# Patient Record
Sex: Female | Born: 1990 | Race: Black or African American | Hispanic: No | Marital: Married | Smoking: Never smoker
Health system: Southern US, Community
[De-identification: ages and names within clinical notes are randomized; demographics above are authoritative.]

## PROBLEM LIST (undated history)

## (undated) DIAGNOSIS — O99210 Obesity complicating pregnancy, unspecified trimester: Secondary | ICD-10-CM

## (undated) DIAGNOSIS — D649 Anemia, unspecified: Secondary | ICD-10-CM

## (undated) HISTORY — PX: NO PAST SURGERIES: SHX2092

---

## 2018-02-17 ENCOUNTER — Ambulatory Visit: Payer: 59 | Admitting: Podiatry

## 2018-02-17 ENCOUNTER — Encounter: Payer: Self-pay | Admitting: Podiatry

## 2018-02-17 ENCOUNTER — Ambulatory Visit (INDEPENDENT_AMBULATORY_CARE_PROVIDER_SITE_OTHER): Payer: 59

## 2018-02-17 VITALS — BP 126/82 | HR 80

## 2018-02-17 DIAGNOSIS — M779 Enthesopathy, unspecified: Secondary | ICD-10-CM

## 2018-02-17 MED ORDER — TRIAMCINOLONE ACETONIDE 10 MG/ML IJ SUSP
10.0000 mg | Freq: Once | INTRAMUSCULAR | Status: AC
Start: 2018-02-17 — End: 2018-02-17
  Administered 2018-02-17: 10 mg

## 2018-02-17 MED ORDER — DICLOFENAC SODIUM 75 MG PO TBEC: 75.0000 mg | DELAYED_RELEASE_TABLET | Freq: Two times a day (BID) | ORAL | 2 refills | Status: DC

## 2018-02-19 NOTE — Progress Notes (Signed)
Subjective:   Patient ID: Kelsey Ellison, female   DOB: 27 y.o.   MRN: [id number]   HPI Patient presents with a lot of pain in the outside of the right ankle and does not remember specific injury.  States is been present for around a month   Review of Systems  All other systems reviewed and are negative.       Objective:  Physical Exam  Constitutional: She appears well-developed and well-nourished.  Cardiovascular: Intact distal pulses.  Pulmonary/Chest: Effort normal.  Musculoskeletal: Normal range of motion.  Neurological: She is alert.  Skin: Skin is warm.  Nursing note and vitals reviewed.   Neurovascular status intact muscle strength is adequate range of motion within normal limits with patient found to have exquisite discomfort in the peroneal insertion base of fifth metatarsal right with fluid buildup noted     Assessment:  Inflammatory tendinitis of the peroneal insertion into the base of the fifth metatarsal right     Plan:  H&P condition reviewed and recommended careful sheath injection right 3 mg Kenalog 5 mg Xylocaine after explaining risk associated with injection.  I then applied a fascial brace to hold up the lateral side of the foot instructed on aggressive ice and placed on diclofenac 75 mg twice daily.  Reappoint in the next several weeks  X-rays were negative for signs of fracture or bone pathology associated with this

## 2018-02-22 ENCOUNTER — Telehealth: Payer: Self-pay | Admitting: Podiatry

## 2018-02-22 MED ORDER — NONFORMULARY OR COMPOUNDED ITEM: 2 refills | Status: DC

## 2018-02-22 NOTE — Telephone Encounter (Signed)
I informed pt Dr. Charlsie Merlesegal would like her to use a topical antiinflammatory cream, it would come from [facility] [phone], they will call with insurance coverage and delivery information. Pt states she will try this medication. [facility]

## 2018-02-22 NOTE — Addendum Note (Signed)
Addended by: Alphia Kava'CONNELL, Lashon Hillier D on: 02/22/2018 03:07 PM   Modules accepted: Orders

## 2018-02-22 NOTE — Telephone Encounter (Signed)
Medication patient was prescribed is not agreeing with her stomach. She wants to know if there is an alternative choice? If you can call patient back at [phone]

## 2018-02-22 NOTE — Telephone Encounter (Signed)
Not sure what medication she was taking

## 2018-08-23 DIAGNOSIS — Z0100 Encounter for examination of eyes and vision without abnormal findings: Secondary | ICD-10-CM | POA: Diagnosis not present

## 2019-06-23 ENCOUNTER — Emergency Department (HOSPITAL_COMMUNITY)
Admission: EM | Admit: 2019-06-23 | Discharge: 2019-06-24 | Disposition: A | Discharge status: Home / Self Care | Payer: 59 | Attending: Emergency Medicine | Admitting: Emergency Medicine

## 2019-06-23 ENCOUNTER — Other Ambulatory Visit: Payer: Self-pay

## 2019-06-23 ENCOUNTER — Encounter (HOSPITAL_COMMUNITY): Payer: Self-pay | Admitting: *Deleted

## 2019-06-23 DIAGNOSIS — E669 Obesity, unspecified: Secondary | ICD-10-CM | POA: Insufficient documentation

## 2019-06-23 DIAGNOSIS — N760 Acute vaginitis: Secondary | ICD-10-CM | POA: Diagnosis not present

## 2019-06-23 DIAGNOSIS — Z113 Encounter for screening for infections with a predominantly sexual mode of transmission: Secondary | ICD-10-CM | POA: Diagnosis not present

## 2019-06-23 DIAGNOSIS — B9689 Other specified bacterial agents as the cause of diseases classified elsewhere: Secondary | ICD-10-CM | POA: Diagnosis not present

## 2019-06-23 DIAGNOSIS — R1031 Right lower quadrant pain: Secondary | ICD-10-CM

## 2019-06-23 DIAGNOSIS — Z79899 Other long term (current) drug therapy: Secondary | ICD-10-CM | POA: Diagnosis not present

## 2019-06-23 DIAGNOSIS — R109 Unspecified abdominal pain: Secondary | ICD-10-CM | POA: Diagnosis not present

## 2019-06-23 LAB — URINALYSIS, ROUTINE W REFLEX MICROSCOPIC
Bilirubin Urine: NEGATIVE
Glucose, UA: NEGATIVE mg/dL
Hgb urine dipstick: NEGATIVE
Ketones, ur: NEGATIVE mg/dL
Nitrite: NEGATIVE
Protein, ur: NEGATIVE mg/dL
Specific Gravity, Urine: 1.033 — ABNORMAL HIGH (ref 1.005–1.030)
WBC, UA: >50 WBC/hpf — ABNORMAL HIGH (ref 0–5)
pH: 5 (ref 5.0–8.0)

## 2019-06-23 LAB — CBC WITH DIFFERENTIAL/PLATELET
Abs Immature Granulocytes: 0.02 10*3/uL (ref 0.00–0.07)
Basophils Absolute: 0.1 10*3/uL (ref 0.0–0.1)
Basophils Relative: 1 %
Eosinophils Absolute: 0.1 10*3/uL (ref 0.0–0.5)
Eosinophils Relative: 1 %
HCT: 39.2 % (ref 36.0–46.0)
Hemoglobin: 12 g/dL (ref 12.0–15.0)
Immature Granulocytes: 0 %
Lymphocytes Relative: 28 %
Lymphs Abs: 2.8 10*3/uL (ref 0.7–4.0)
MCH: 27.9 pg (ref 26.0–34.0)
MCHC: 30.6 g/dL (ref 30.0–36.0)
MCV: 91.2 fL (ref 80.0–100.0)
Monocytes Absolute: 0.5 10*3/uL (ref 0.1–1.0)
Monocytes Relative: 5 %
Neutro Abs: 6.4 10*3/uL (ref 1.7–7.7)
Neutrophils Relative %: 65 %
Platelets: 401 10*3/uL — ABNORMAL HIGH (ref 150–400)
RBC: 4.3 MIL/uL (ref 3.87–5.11)
RDW: 14.3 % (ref 11.5–15.5)
WBC: 9.9 10*3/uL (ref 4.0–10.5)
nRBC: 0 % (ref 0.0–0.2)

## 2019-06-23 LAB — WET PREP, GENITAL
Sperm: NONE SEEN
Trich, Wet Prep: NONE SEEN
Yeast Wet Prep HPF POC: NONE SEEN

## 2019-06-23 LAB — I-STAT BETA HCG BLOOD, ED (MC, WL, AP ONLY): I-stat hCG, quantitative: <5 m[IU]/mL (ref <5)

## 2019-06-23 MED ORDER — SODIUM CHLORIDE 0.9 % IV BOLUS
1000.0000 mL | Freq: Once | INTRAVENOUS | Status: AC
  Administered 2019-06-23: 1000 mL via INTRAVENOUS

## 2019-06-23 NOTE — ED Triage Notes (Signed)
Lower abdominal cramping that started 4 days ago, after her period stopped. She is also having some intermittent vaginal bleeding. No n/v/d or fevers, urinary symptoms or vaginal d/c

## 2019-06-23 NOTE — ED Provider Notes (Signed)
[facility] EM[facility]GENCY [facility]PART[facility] Provider Note   CSN: [id number] Arrival date & time: 06/23/19  2021     History   Chief Complaint Chief Complaint  Patient presents with  . Pelvic Pain    HPI Kelsey Ellison is a 28 y.o. female with no significant past medical history who presents today for evaluation of abdominal pain.  Her pain started 4 days ago.  When it started it was initially in the midline upper abdomen.  She reports that since then it has settled into the lower abdomen and feels cramping in nature.  She has tried Aleve and Tylenol with minor relief.  She denies any constipation, nausea, vomiting, or diarrhea.  She is sexually active with one female partner and denies concerns about STIs.  She denies any abnormal vaginal discharge or bleeding.  Her last menstrual cycle was normal.  She reports that originally laying flat helped however it is no longer helping.  She denies any previous abdominal surgeries.HPI  History reviewed. No pertinent past medical history.  There are no active problems to display for this patient.   History reviewed. No pertinent surgical history.   OB History   No obstetric history on file.      Home Medications    Prior to Admission medications   Medication Sig Start Date End Date Taking? Authorizing Provider  acetaminophen (TYLENOL) 500 MG tablet Take 1,000 mg by mouth every 6 (six) hours as needed (pain).   Yes [provider]  Multiple Vitamin (MULTIVITAMIN WITH MINERALS) TABS tablet Take 1 tablet by mouth every morning.   Yes [provider]  naproxen sodium (ALEVE) 220 MG tablet Take 220-440 mg by mouth 2 (two) times daily as needed (pain).   Yes [provider]  diclofenac (VOLTAREN) 75 MG EC tablet Take 1 tablet (75 mg total) by mouth 2 (two) times daily. Patient not taking: Reported on 06/23/2019 02/17/18   Lenn Sinkegal, Norman S, DPM  metroNIDAZOLE (FLAGYL) 500 MG tablet Take 1 tablet (500 mg total)  by mouth 2 (two) times daily. 06/24/19   Cristina GongHammond, Rylea Selway W, PA-C  NONFORMULARY OR COMPOUNDED ITEM [facility]:  Antiinflammatory Cream - Diclofenac 3%, Baclofen 2%, Lidocaine 2%, apply 1-2 grams to affected area 3-4 times a day. Patient not taking: Reported on 06/23/2019 02/22/18   Lenn Sinkegal, Norman S, DPM    Family History No family history on file.  Social History Social History   Tobacco Use  . Smoking status: Never Smoker  . Smokeless tobacco: Never Used  Substance Use Topics  . Alcohol use: Yes  . Drug use: Not Currently     Allergies   Patient has no known allergies.   Review of Systems Review of Systems  Constitutional: Negative for chills and fever.  Respiratory: Negative for chest tightness and shortness of breath.   Cardiovascular: Negative for chest pain.  Gastrointestinal: Positive for abdominal pain. Negative for diarrhea, nausea and vomiting.  Genitourinary: Negative for decreased urine volume, difficulty urinating, dysuria, flank pain, frequency, pelvic pain, urgency, vaginal bleeding, vaginal discharge and vaginal pain.  Musculoskeletal: Negative for back pain.  All other systems reviewed and are negative.    Physical Exam Updated Vital Signs BP (!) 169/68 (BP Location: Left Arm)   Pulse 62   Temp 99 F (37.2 C) (Oral)   Resp 16   LMP 06/09/2019   SpO2 100%   Physical Exam Vitals signs and nursing note reviewed. Exam conducted with a chaperone present (Female RN).  Constitutional:  General: She is not in acute distress.    Appearance: She is well-developed. She is obese.  HENT:     Head: Normocephalic and atraumatic.  Eyes:     Conjunctiva/sclera: Conjunctivae normal.  Neck:     Musculoskeletal: Neck supple.  Cardiovascular:     Rate and Rhythm: Normal rate and regular rhythm.     Heart sounds: No murmur.  Pulmonary:     Effort: Pulmonary effort is normal. No respiratory distress.     Breath sounds: Normal breath sounds.  Abdominal:      General: Bowel sounds are normal. There is no distension.     Palpations: Abdomen is soft.     Tenderness: There is abdominal tenderness in the right lower quadrant and suprapubic area. There is no right CVA tenderness, left CVA tenderness, guarding or rebound.     Comments: Exam limited by body habitus  Genitourinary:    Vagina: Normal.     Cervix: Normal.     Adnexa: Right adnexa normal and left adnexa normal.     Comments: Normal external female genitalia.  Mild midline TTP with out CMT.  Skin:    General: Skin is warm and dry.  Neurological:     Mental Status: She is alert.      ED Treatments / Results  Labs (all labs ordered are listed, but only abnormal results are displayed) Labs Reviewed  WET PREP, GENITAL - Abnormal; Notable for the following components:      Result Value   Clue Cells Wet Prep HPF POC PRESENT (*)    WBC, Wet Prep HPF POC MANY (*)    All other components within normal limits  URINALYSIS, ROUTINE W REFLEX MICROSCOPIC - Abnormal; Notable for the following components:   APPearance HAZY (*)    Specific Gravity, Urine 1.033 (*)    Leukocytes,Ua LARGE (*)    WBC, UA >50 (*)    Bacteria, UA RARE (*)    All other components within normal limits  COMPREHENSIVE METABOLIC PANEL - Abnormal; Notable for the following components:   Sodium 133 (*)    Calcium 8.8 (*)    Albumin 3.4 (*)    All other components within normal limits  CBC WITH DIFFERENTIAL/PLATELET - Abnormal; Notable for the following components:   Platelets 401 (*)    All other components within normal limits  LIPASE, BLOOD  I-STAT BETA HCG BLOOD, ED ([facility], [facility], [facility] ONLY)  GC/CHLAMYDIA PROBE AMP ([place]) NOT AT [facility]    EKG None  Radiology Ct Abdomen Pelvis W Contrast  Result Date: 06/24/2019 CLINICAL DATA:  Abdominal pain EXAM: CT ABDOMEN AND PELVIS WITH CONTRAST TECHNIQUE: Multidetector CT imaging of the abdomen and pelvis was performed using the standard protocol following bolus  administration of intravenous contrast. CONTRAST:  OMNIPAQUE IOHEXOL 300 MG/ML  SOLN COMPARISON:  None. FINDINGS: LOWER CHEST: No basilar pleural or apical pericardial effusion. HEPATOBILIARY: Normal hepatic contours. There is no intra- or extrahepatic biliary dilatation. The gallbladder is normal. PANCREAS: Normal pancreatic contours without pancreatic ductal dilatation or peripancreatic fluid collection. SPLEEN: Normal. ADRENALS/URINARY TRACT: --Adrenal glands: Normal. --Right kidney/ureter: No hydronephrosis, nephroureterolithiasis or solid renal mass. --Left kidney/ureter: No hydronephrosis, nephroureterolithiasis or solid renal mass. --Urinary bladder: Normal for degree of distention STOMACH/BOWEL: --Stomach/Duodenum: There is no hiatal hernia. The duodenal course and caliber are normal. --Small bowel: No dilatation or inflammation. --Colon: No focal abnormality. --Appendix: Normal. VASCULAR/LYMPHATIC: Normal course and caliber of the major abdominal vessels. No abdominal or pelvic lymphadenopathy. REPRODUCTIVE:  Normal uterus and ovaries. MUSCULOSKELETAL. No bony spinal canal stenosis or focal osseous abnormality. OTHER: None. IMPRESSION: No acute abdominopelvic abnormality. Electronically Signed   By: Ulyses Jarred M.D.   On: 06/24/2019 01:12    Procedures Procedures (including critical care time)  Medications Ordered in ED Medications  sodium chloride 0.9 % bolus 1,000 mL (1,000 mLs Intravenous New Bag/Given 06/23/19 2358)  iohexol (OMNIPAQUE) 300 MG/ML solution 100 mL (100 mLs Intravenous Contrast Given 06/24/19 0041)     Initial Impression / Assessment and Plan / ED Course  I have reviewed the triage vital signs and the nursing notes.  Pertinent labs & imaging results that were available during my care of the patient were reviewed by me and considered in my medical decision making (see chart for details).       Patient presents today for evaluation of abdominal pain.  Her abdominal  pain initially started in the upper midline however is now in the lower abdomen.  She denies any nausea vomiting diarrhea or constipation.  Here she is afebrile and hypertensive but generally well-appearing.  Pelvic exam was performed with slight midline tenderness.  Labs were obtained and reviewed, her pregnancy test is negative.  Lipase is not elevated.  CMP and CBC without significant hematologic or electrolyte derangements.  Gonorrhea chlamydia testing was sent.  Wet prep is positive for clue cells and white blood cells. On exam she had tenderness to palpation in the right lower abdominal quadrant.  She still has her appendix.  CT scan abdomen pelvis was obtained without evidence of acute abnormalities.  While her urine did have over 50 white blood cells it was also contaminated with 6-10 squamous epithelial cells and her wet prep shows many white blood cells.  Given her lack of urinary symptoms I will not treat her for urinary tract infection at this time.  Recommended OB/GYN follow-up.  Return precautions were discussed with patient who states their understanding.  At the time of discharge patient denied any unaddressed complaints or concerns.  Patient is agreeable for discharge home.   Final Clinical Impressions(s) / ED Diagnoses   Final diagnoses:  Bacterial vaginosis  Right lower quadrant abdominal pain    ED Discharge Orders         Ordered    metroNIDAZOLE (FLAGYL) 500 MG tablet  2 times daily     06/24/19 0113           Lorin Glass, PA-C 06/24/19 0124    Veryl Speak, MD 06/24/19 0140

## 2019-06-24 ENCOUNTER — Emergency Department (HOSPITAL_COMMUNITY): Payer: 59

## 2019-06-24 DIAGNOSIS — R109 Unspecified abdominal pain: Secondary | ICD-10-CM | POA: Diagnosis not present

## 2019-06-24 DIAGNOSIS — N760 Acute vaginitis: Secondary | ICD-10-CM | POA: Diagnosis not present

## 2019-06-24 DIAGNOSIS — E669 Obesity, unspecified: Secondary | ICD-10-CM | POA: Diagnosis not present

## 2019-06-24 DIAGNOSIS — Z113 Encounter for screening for infections with a predominantly sexual mode of transmission: Secondary | ICD-10-CM | POA: Diagnosis not present

## 2019-06-24 DIAGNOSIS — Z79899 Other long term (current) drug therapy: Secondary | ICD-10-CM | POA: Diagnosis not present

## 2019-06-24 LAB — COMPREHENSIVE METABOLIC PANEL
ALT: 18 U/L (ref 0–44)
AST: 16 U/L (ref 15–41)
Albumin: 3.4 g/dL — ABNORMAL LOW (ref 3.5–5.0)
Alkaline Phosphatase: 60 U/L (ref 38–126)
Anion gap: 6 (ref 5–15)
BUN: 8 mg/dL (ref 6–20)
CO2: 23 mmol/L (ref 22–32)
Calcium: 8.8 mg/dL — ABNORMAL LOW (ref 8.9–10.3)
Chloride: 104 mmol/L (ref 98–111)
Creatinine, Ser: 0.82 mg/dL (ref 0.44–1.00)
GFR calc Af Amer: >60 mL/min (ref >60)
GFR calc non Af Amer: >60 mL/min (ref >60)
Glucose, Bld: 96 mg/dL (ref 70–99)
Potassium: 3.9 mmol/L (ref 3.5–5.1)
Sodium: 133 mmol/L — ABNORMAL LOW (ref 135–145)
Total Bilirubin: 0.6 mg/dL (ref 0.3–1.2)
Total Protein: 7.7 g/dL (ref 6.5–8.1)

## 2019-06-24 LAB — LIPASE, BLOOD: Lipase: 22 U/L (ref 11–51)

## 2019-06-24 MED ORDER — IOHEXOL 300 MG/ML  SOLN
100.0000 mL | Freq: Once | INTRAMUSCULAR | Status: AC | PRN
  Administered 2019-06-24: 100 mL via INTRAVENOUS

## 2019-06-24 MED ORDER — METRONIDAZOLE 500 MG PO TABS: 500.0000 mg | ORAL_TABLET | Freq: Two times a day (BID) | ORAL | 0 refills | Status: DC

## 2019-06-24 NOTE — Discharge Instructions (Addendum)
Today your diagnosed with bacterial vaginosis and received a prescription for metronidazole also known as Flagyl. It is very important that you do not consume any alcohol while taking this medication as it will cause you to become violently ill.  Please take Ibuprofen (Advil, motrin) and Tylenol (acetaminophen) to relieve your pain.  You may take up to 600 MG (3 pills) of normal strength ibuprofen every 8 hours as needed.  In between doses of ibuprofen you make take tylenol, up to 1,000 mg (two extra strength pills).  Do not take more than 3,000 mg tylenol in a 24 hour period.  Please check all medication labels as many medications such as pain and cold medications may contain tylenol.  Do not drink alcohol while taking these medications.  Do not take other NSAID'S while taking ibuprofen (such as aleve or naproxen).  Please take ibuprofen with food to decrease stomach upset.  You may have diarrhea from the antibiotics.  It is very important that you continue to take the antibiotics even if you get diarrhea unless a medical professional tells you that you may stop taking them.  If you stop too early the bacteria you are being treated for will become stronger and you may need different, more powerful antibiotics that have more side effects and worsening diarrhea.  Please stay well hydrated and consider probiotics as they may decrease the severity of your diarrhea.  Please be aware that if you take any hormonal contraception (birth control pills, nexplanon, the ring, etc) that your birth control will not work while you are taking antibiotics and you need to use back up protection as directed on the birth control medication information insert.

## 2019-06-25 LAB — GC/CHLAMYDIA PROBE AMP (~~LOC~~) NOT AT ARMC
Chlamydia: NEGATIVE
Neisseria Gonorrhea: NEGATIVE

## 2019-07-02 DIAGNOSIS — R102 Pelvic and perineal pain: Secondary | ICD-10-CM | POA: Diagnosis not present

## 2019-07-16 DIAGNOSIS — N83202 Unspecified ovarian cyst, left side: Secondary | ICD-10-CM | POA: Diagnosis not present

## 2019-07-16 DIAGNOSIS — R102 Pelvic and perineal pain: Secondary | ICD-10-CM | POA: Diagnosis not present

## 2019-07-26 DIAGNOSIS — R102 Pelvic and perineal pain: Secondary | ICD-10-CM | POA: Diagnosis not present

## 2019-08-12 DIAGNOSIS — Z01419 Encounter for gynecological examination (general) (routine) without abnormal findings: Secondary | ICD-10-CM | POA: Diagnosis not present

## 2019-08-12 DIAGNOSIS — Z113 Encounter for screening for infections with a predominantly sexual mode of transmission: Secondary | ICD-10-CM | POA: Diagnosis not present

## 2019-08-12 DIAGNOSIS — Z3040 Encounter for surveillance of contraceptives, unspecified: Secondary | ICD-10-CM | POA: Diagnosis not present

## 2019-08-12 DIAGNOSIS — N83209 Unspecified ovarian cyst, unspecified side: Secondary | ICD-10-CM | POA: Diagnosis not present

## 2019-08-12 DIAGNOSIS — Z124 Encounter for screening for malignant neoplasm of cervix: Secondary | ICD-10-CM | POA: Diagnosis not present

## 2019-08-12 DIAGNOSIS — R102 Pelvic and perineal pain: Secondary | ICD-10-CM | POA: Diagnosis not present

## 2019-08-29 DIAGNOSIS — Z0100 Encounter for examination of eyes and vision without abnormal findings: Secondary | ICD-10-CM | POA: Diagnosis not present

## 2019-09-30 DIAGNOSIS — N920 Excessive and frequent menstruation with regular cycle: Secondary | ICD-10-CM | POA: Diagnosis not present

## 2019-09-30 DIAGNOSIS — N83202 Unspecified ovarian cyst, left side: Secondary | ICD-10-CM | POA: Diagnosis not present

## 2019-09-30 DIAGNOSIS — Z6841 Body Mass Index (BMI) 40.0 and over, adult: Secondary | ICD-10-CM | POA: Diagnosis not present

## 2019-09-30 DIAGNOSIS — R102 Pelvic and perineal pain: Secondary | ICD-10-CM | POA: Diagnosis not present

## 2019-09-30 DIAGNOSIS — Z01419 Encounter for gynecological examination (general) (routine) without abnormal findings: Secondary | ICD-10-CM | POA: Diagnosis not present

## 2019-09-30 DIAGNOSIS — Z1151 Encounter for screening for human papillomavirus (HPV): Secondary | ICD-10-CM | POA: Diagnosis not present

## 2019-10-01 DIAGNOSIS — N920 Excessive and frequent menstruation with regular cycle: Secondary | ICD-10-CM | POA: Diagnosis not present

## 2019-10-01 DIAGNOSIS — N946 Dysmenorrhea, unspecified: Secondary | ICD-10-CM | POA: Diagnosis not present

## 2019-10-01 DIAGNOSIS — N83202 Unspecified ovarian cyst, left side: Secondary | ICD-10-CM | POA: Diagnosis not present

## 2019-10-01 DIAGNOSIS — Z1329 Encounter for screening for other suspected endocrine disorder: Secondary | ICD-10-CM | POA: Diagnosis not present

## 2019-10-01 DIAGNOSIS — R1032 Left lower quadrant pain: Secondary | ICD-10-CM | POA: Diagnosis not present

## 2019-10-01 DIAGNOSIS — Z130 Encounter for screening for diseases of the blood and blood-forming organs and certain disorders involving the immune mechanism: Secondary | ICD-10-CM | POA: Diagnosis not present

## 2019-10-01 DIAGNOSIS — Z131 Encounter for screening for diabetes mellitus: Secondary | ICD-10-CM | POA: Diagnosis not present

## 2019-10-01 DIAGNOSIS — Z0000 Encounter for general adult medical examination without abnormal findings: Secondary | ICD-10-CM | POA: Diagnosis not present

## 2019-10-01 DIAGNOSIS — Z13220 Encounter for screening for lipoid disorders: Secondary | ICD-10-CM | POA: Diagnosis not present

## 2019-10-10 DIAGNOSIS — M62838 Other muscle spasm: Secondary | ICD-10-CM | POA: Diagnosis not present

## 2019-10-10 DIAGNOSIS — R10814 Left lower quadrant abdominal tenderness: Secondary | ICD-10-CM | POA: Diagnosis not present

## 2019-10-10 DIAGNOSIS — N3941 Urge incontinence: Secondary | ICD-10-CM | POA: Diagnosis not present

## 2019-10-10 DIAGNOSIS — M6281 Muscle weakness (generalized): Secondary | ICD-10-CM | POA: Diagnosis not present

## 2019-10-10 DIAGNOSIS — R1032 Left lower quadrant pain: Secondary | ICD-10-CM | POA: Diagnosis not present

## 2019-10-14 DIAGNOSIS — M62838 Other muscle spasm: Secondary | ICD-10-CM | POA: Diagnosis not present

## 2019-10-14 DIAGNOSIS — R1032 Left lower quadrant pain: Secondary | ICD-10-CM | POA: Diagnosis not present

## 2019-10-14 DIAGNOSIS — R10814 Left lower quadrant abdominal tenderness: Secondary | ICD-10-CM | POA: Diagnosis not present

## 2019-10-14 DIAGNOSIS — N3941 Urge incontinence: Secondary | ICD-10-CM | POA: Diagnosis not present

## 2019-10-14 DIAGNOSIS — M6281 Muscle weakness (generalized): Secondary | ICD-10-CM | POA: Diagnosis not present

## 2019-10-17 DIAGNOSIS — Z3689 Encounter for other specified antenatal screening: Secondary | ICD-10-CM | POA: Diagnosis not present

## 2019-10-17 DIAGNOSIS — O26851 Spotting complicating pregnancy, first trimester: Secondary | ICD-10-CM | POA: Diagnosis not present

## 2019-10-17 DIAGNOSIS — Z3A01 Less than 8 weeks gestation of pregnancy: Secondary | ICD-10-CM | POA: Diagnosis not present

## 2019-10-17 DIAGNOSIS — Z3200 Encounter for pregnancy test, result unknown: Secondary | ICD-10-CM | POA: Diagnosis not present

## 2019-10-21 DIAGNOSIS — O26859 Spotting complicating pregnancy, unspecified trimester: Secondary | ICD-10-CM | POA: Diagnosis not present

## 2019-10-21 DIAGNOSIS — Z3A08 8 weeks gestation of pregnancy: Secondary | ICD-10-CM | POA: Diagnosis not present

## 2019-10-23 DIAGNOSIS — O039 Complete or unspecified spontaneous abortion without complication: Secondary | ICD-10-CM | POA: Diagnosis not present

## 2019-10-28 DIAGNOSIS — O039 Complete or unspecified spontaneous abortion without complication: Secondary | ICD-10-CM | POA: Diagnosis not present

## 2019-10-31 DIAGNOSIS — R1032 Left lower quadrant pain: Secondary | ICD-10-CM | POA: Diagnosis not present

## 2019-10-31 DIAGNOSIS — M62838 Other muscle spasm: Secondary | ICD-10-CM | POA: Diagnosis not present

## 2019-10-31 DIAGNOSIS — M6281 Muscle weakness (generalized): Secondary | ICD-10-CM | POA: Diagnosis not present

## 2019-10-31 DIAGNOSIS — N3941 Urge incontinence: Secondary | ICD-10-CM | POA: Diagnosis not present

## 2019-10-31 DIAGNOSIS — R10814 Left lower quadrant abdominal tenderness: Secondary | ICD-10-CM | POA: Diagnosis not present

## 2019-11-13 DIAGNOSIS — M6281 Muscle weakness (generalized): Secondary | ICD-10-CM | POA: Diagnosis not present

## 2019-11-13 DIAGNOSIS — M62838 Other muscle spasm: Secondary | ICD-10-CM | POA: Diagnosis not present

## 2019-11-13 DIAGNOSIS — R10814 Left lower quadrant abdominal tenderness: Secondary | ICD-10-CM | POA: Diagnosis not present

## 2019-11-13 DIAGNOSIS — R1032 Left lower quadrant pain: Secondary | ICD-10-CM | POA: Diagnosis not present

## 2019-11-13 DIAGNOSIS — N3941 Urge incontinence: Secondary | ICD-10-CM | POA: Diagnosis not present

## 2019-11-20 DIAGNOSIS — M62838 Other muscle spasm: Secondary | ICD-10-CM | POA: Diagnosis not present

## 2019-11-20 DIAGNOSIS — R1032 Left lower quadrant pain: Secondary | ICD-10-CM | POA: Diagnosis not present

## 2019-11-20 DIAGNOSIS — R10814 Left lower quadrant abdominal tenderness: Secondary | ICD-10-CM | POA: Diagnosis not present

## 2019-11-20 DIAGNOSIS — N3941 Urge incontinence: Secondary | ICD-10-CM | POA: Diagnosis not present

## 2019-11-20 DIAGNOSIS — M6281 Muscle weakness (generalized): Secondary | ICD-10-CM | POA: Diagnosis not present

## 2019-11-22 DIAGNOSIS — N96 Recurrent pregnancy loss: Secondary | ICD-10-CM | POA: Diagnosis not present

## 2019-12-06 DIAGNOSIS — Z298 Encounter for other specified prophylactic measures: Secondary | ICD-10-CM | POA: Diagnosis not present

## 2019-12-22 IMAGING — CT CT ABD-PELV W/ CM
2 of 4 series · 16 of 46 positions shown, 18 images · IV contrast (APPLIED)
Comparison: None.

CLINICAL DATA: Abdominal pain

EXAM:
CT ABDOMEN AND PELVIS WITH CONTRAST
TECHNIQUE: Multidetector CT imaging of the abdomen and pelvis was performed
using the standard protocol following bolus administration of
intravenous contrast.
CONTRAST:  100mL OMNIPAQUE IOHEXOL 300 MG/ML  SOLN

[Series 3: abd/ pelvis 5.0 i30f 2 · axial · 0.98mm/px · z∈[-569,-119]mm · 13 of 100 slices shown, 15 images]
[im 5/100  soft-tissue]
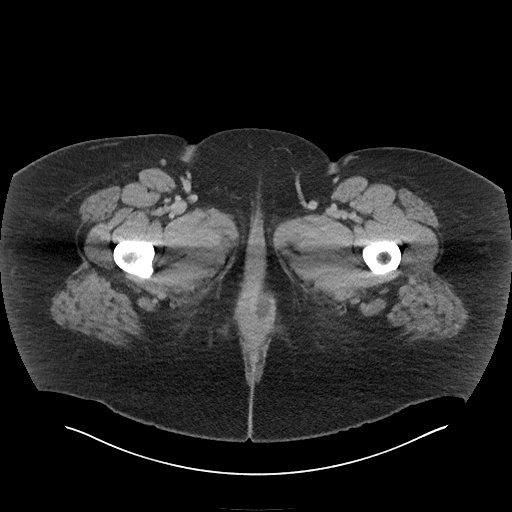
[im 5/100  bone]
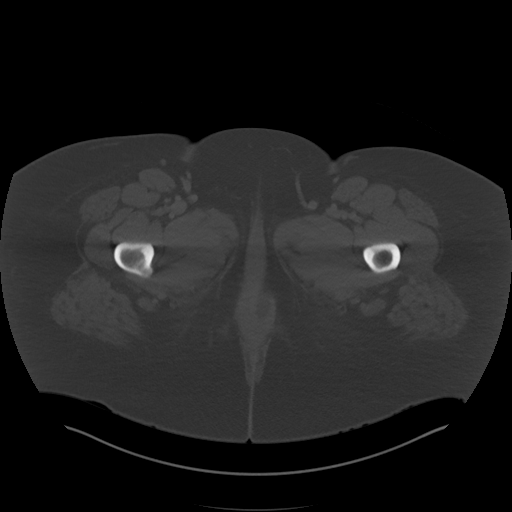
[im 14/100  soft-tissue]
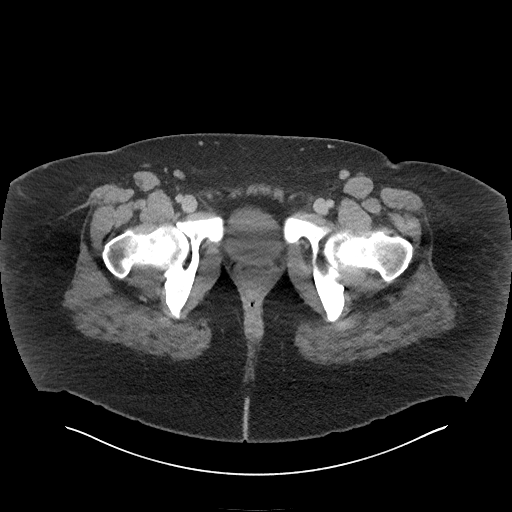
[im 23/100  soft-tissue]
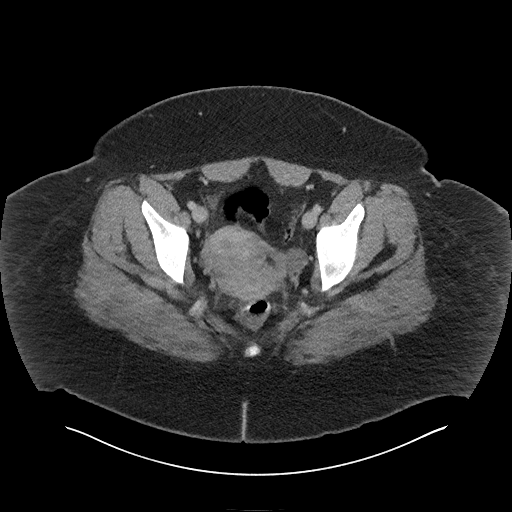
[im 28/100  soft-tissue]
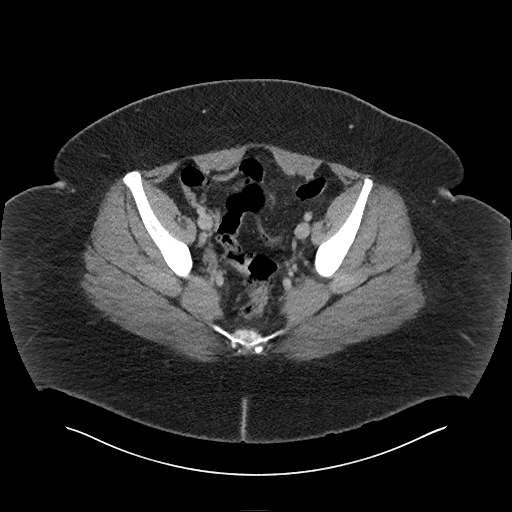
[im 37/100  soft-tissue]
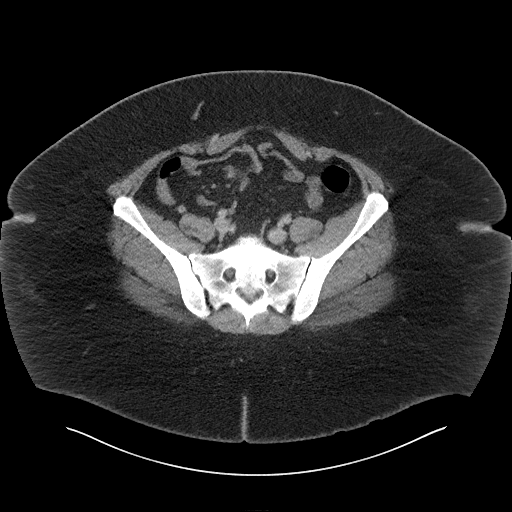
[im 41/100  soft-tissue]
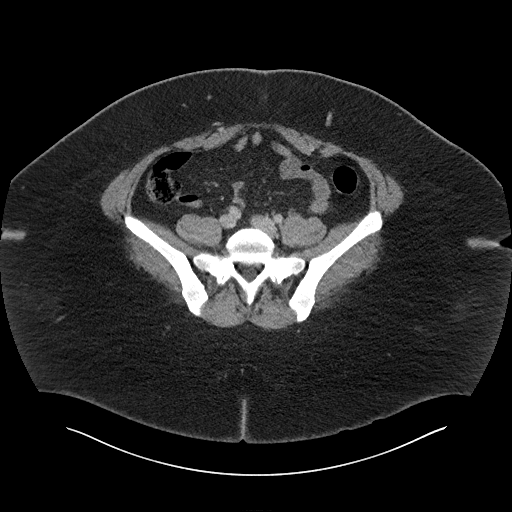
[im 50/100  soft-tissue]
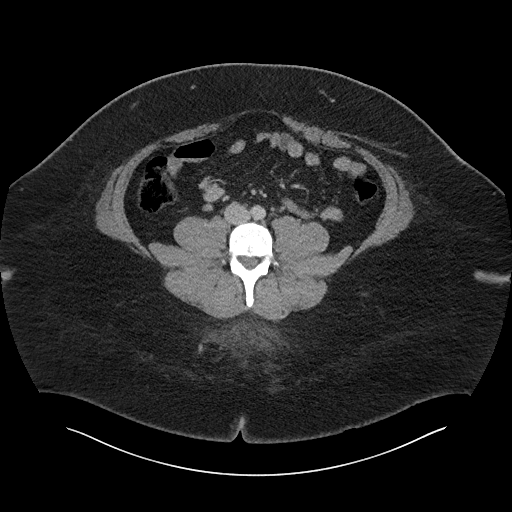
[im 59/100  soft-tissue]
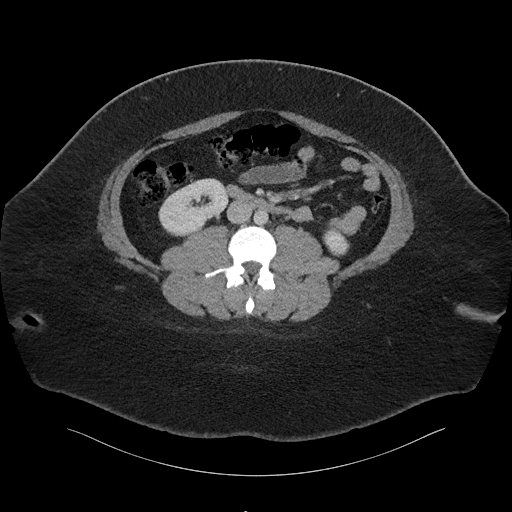
[im 64/100  soft-tissue]
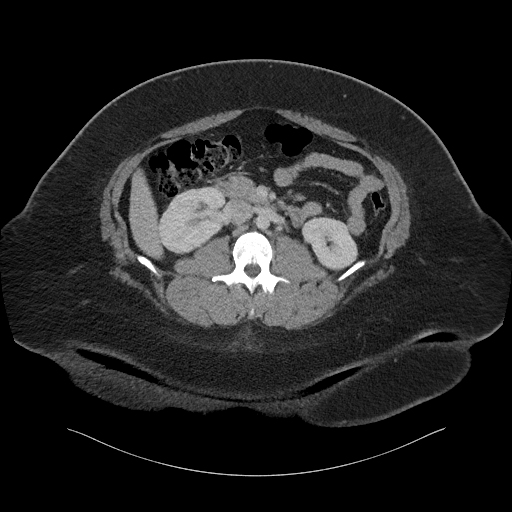
[im 64/100  bone]
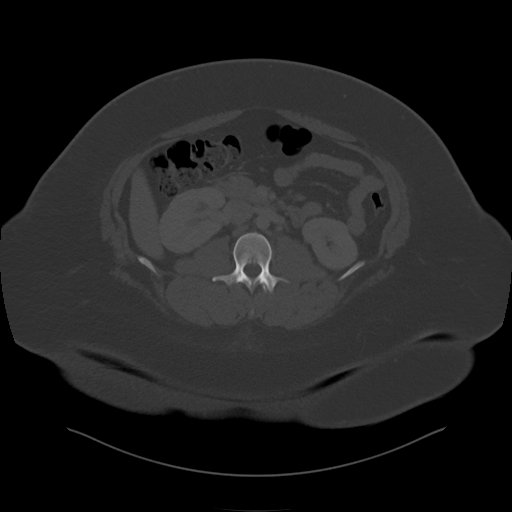
[im 73/100  soft-tissue]
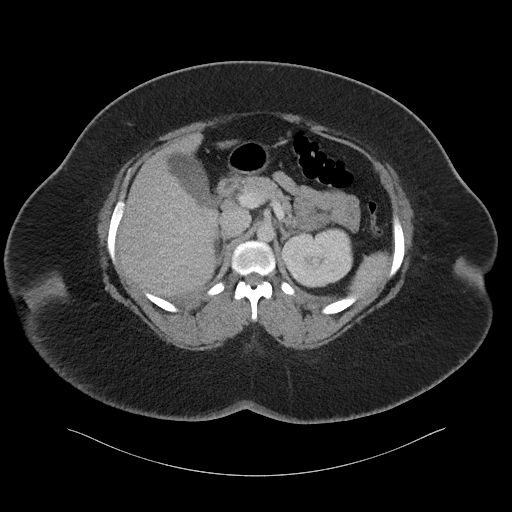
[im 77/100  soft-tissue]
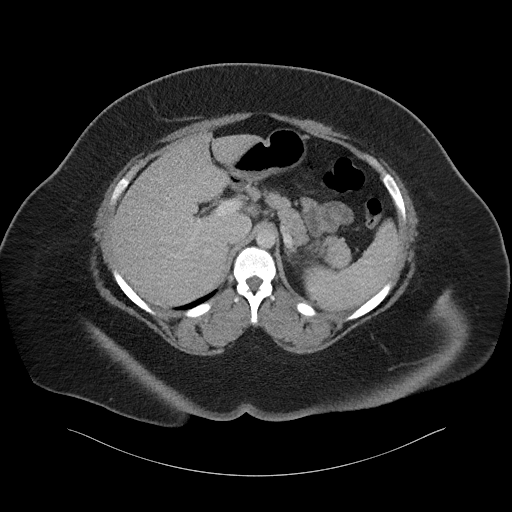
[im 86/100  soft-tissue]
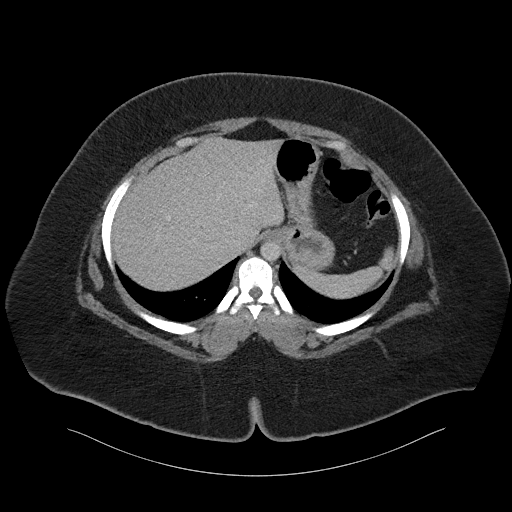
[im 95/100  soft-tissue]
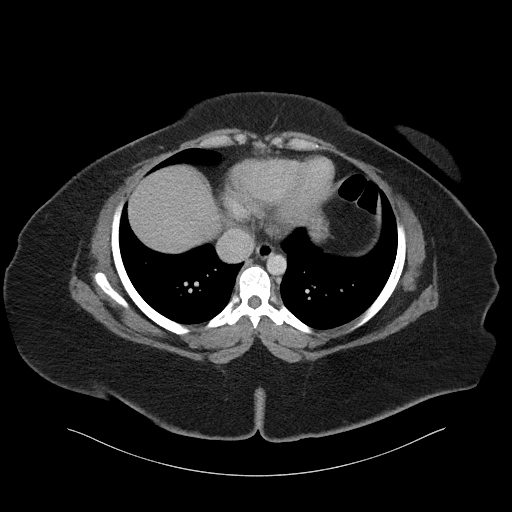

[Series 6: coronal soft tissue · coronal · 0.75mm/px · 3 of 90 slices shown]
[im 30/90  soft-tissue]
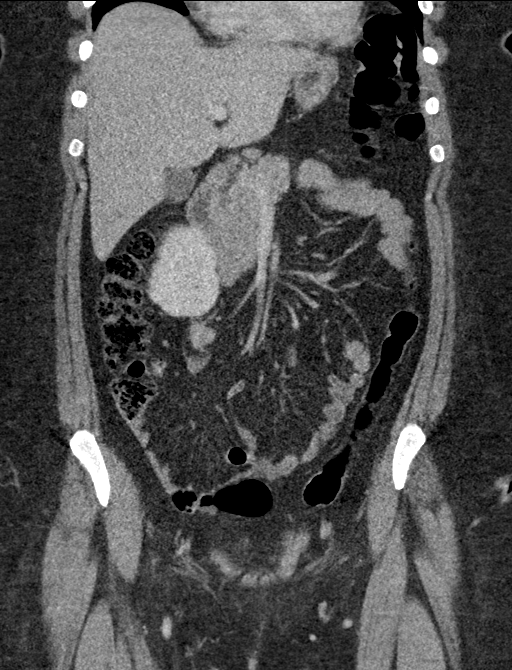
[im 40/90  soft-tissue]
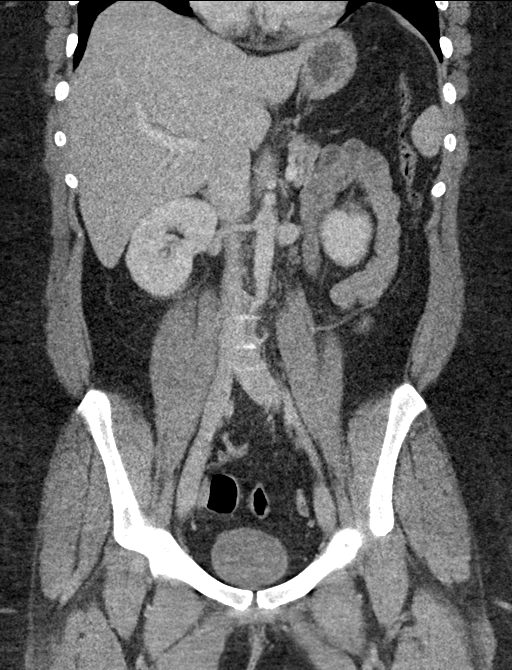
[im 50/90  soft-tissue]
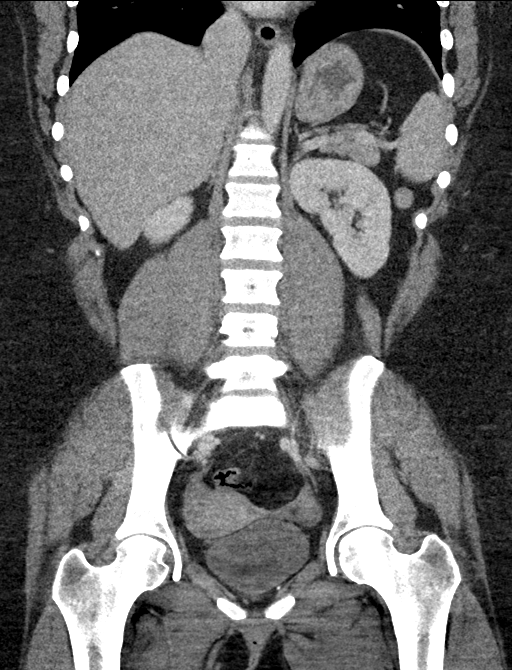

[16 of 46 positions shown; findings below may reference images not displayed]

FINDINGS: LOWER CHEST: No basilar pleural or apical pericardial effusion.

HEPATOBILIARY: Normal hepatic contours. There is no intra- or
extrahepatic biliary dilatation. The gallbladder is normal.

PANCREAS: Normal pancreatic contours without pancreatic ductal
dilatation or peripancreatic fluid collection.

SPLEEN: Normal.

ADRENALS/URINARY TRACT:

--Adrenal glands: Normal.

--Right kidney/ureter: No hydronephrosis, nephroureterolithiasis or
solid renal mass.

--Left kidney/ureter: No hydronephrosis, nephroureterolithiasis or
solid renal mass.

--Urinary bladder: Normal for degree of distention

STOMACH/BOWEL:

--Stomach/Duodenum: There is no hiatal hernia. The duodenal course
and caliber are normal.

--Small bowel: No dilatation or inflammation.

--Colon: No focal abnormality.

--Appendix: Normal.

VASCULAR/LYMPHATIC: Normal course and caliber of the major abdominal
vessels. No abdominal or pelvic lymphadenopathy.

REPRODUCTIVE: Normal uterus and ovaries.

MUSCULOSKELETAL. No bony spinal canal stenosis or focal osseous
abnormality.

OTHER: None.
IMPRESSION: No acute abdominopelvic abnormality.

## 2019-12-30 DIAGNOSIS — Z3141 Encounter for fertility testing: Secondary | ICD-10-CM | POA: Diagnosis not present

## 2020-01-27 DIAGNOSIS — Z8759 Personal history of other complications of pregnancy, childbirth and the puerperium: Secondary | ICD-10-CM | POA: Diagnosis not present

## 2020-01-27 DIAGNOSIS — Z3200 Encounter for pregnancy test, result unknown: Secondary | ICD-10-CM | POA: Diagnosis not present

## 2020-01-27 DIAGNOSIS — Z3689 Encounter for other specified antenatal screening: Secondary | ICD-10-CM | POA: Diagnosis not present

## 2020-01-30 DIAGNOSIS — Z8759 Personal history of other complications of pregnancy, childbirth and the puerperium: Secondary | ICD-10-CM | POA: Diagnosis not present

## 2020-02-03 DIAGNOSIS — Z8759 Personal history of other complications of pregnancy, childbirth and the puerperium: Secondary | ICD-10-CM | POA: Diagnosis not present

## 2020-02-10 DIAGNOSIS — O021 Missed abortion: Secondary | ICD-10-CM | POA: Diagnosis not present

## 2020-02-19 DIAGNOSIS — N96 Recurrent pregnancy loss: Secondary | ICD-10-CM | POA: Diagnosis not present

## 2020-03-12 DIAGNOSIS — Z3141 Encounter for fertility testing: Secondary | ICD-10-CM | POA: Diagnosis not present

## 2020-03-12 DIAGNOSIS — N8500 Endometrial hyperplasia, unspecified: Secondary | ICD-10-CM | POA: Diagnosis not present

## 2020-04-15 DIAGNOSIS — N96 Recurrent pregnancy loss: Secondary | ICD-10-CM | POA: Diagnosis not present

## 2020-04-15 DIAGNOSIS — Z3201 Encounter for pregnancy test, result positive: Secondary | ICD-10-CM | POA: Diagnosis not present

## 2020-04-15 DIAGNOSIS — Z3200 Encounter for pregnancy test, result unknown: Secondary | ICD-10-CM | POA: Diagnosis not present

## 2020-04-17 DIAGNOSIS — Z3200 Encounter for pregnancy test, result unknown: Secondary | ICD-10-CM | POA: Diagnosis not present

## 2020-05-01 DIAGNOSIS — O0900 Supervision of pregnancy with history of infertility, unspecified trimester: Secondary | ICD-10-CM | POA: Diagnosis not present

## 2020-06-26 LAB — OB RESULTS CONSOLE ABO/RH: RH Type: POSITIVE

## 2020-06-26 LAB — OB RESULTS CONSOLE GC/CHLAMYDIA
Chlamydia: NEGATIVE
Gonorrhea: NEGATIVE

## 2020-06-26 LAB — OB RESULTS CONSOLE RPR: RPR: NONREACTIVE

## 2020-06-26 LAB — OB RESULTS CONSOLE ANTIBODY SCREEN: Antibody Screen: NEGATIVE

## 2020-06-26 LAB — OB RESULTS CONSOLE GBS: GBS: POSITIVE

## 2020-06-26 LAB — OB RESULTS CONSOLE HEPATITIS B SURFACE ANTIGEN: Hepatitis B Surface Ag: NEGATIVE

## 2020-06-26 LAB — OB RESULTS CONSOLE HIV ANTIBODY (ROUTINE TESTING): HIV: NONREACTIVE

## 2020-08-06 LAB — OB RESULTS CONSOLE RUBELLA ANTIBODY, IGM: Rubella: IMMUNE

## 2020-09-19 NOTE — L&D Delivery Note (Signed)
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  Presentation: vertex; Position: Occiput,, Anterior; Station: +3.( crowning)  Verbal consent: obtained from patient.  Risks and benefits discussed in detail.  Risks include, but are not limited to the risks of anesthesia, bleeding, infection, damage to maternal tissues, fetal cephalhematoma.  There is also the risk of inability to effect vaginal delivery of the head, or shoulder dystocia that cannot be resolved by established maneuvers, leading to the need for emergency cesarean section. Indication: terminal bradycardia  APGAR: 8, 9; weight pending .   Placenta status: spontaneous intact  not sent .   Cord: CAN x 1 reducible with the following complications:  velamentous insertion.  Cord pH: n/a  Anesthesia:  epidural, local Instruments: mushroom vacuum Episiotomy: None Lacerations:  bilateral Labial minora ; right vaginal Sulcus, 2nd perineal Suture Repair: 3.0 chromic Est. Blood Loss (mL): 200   Mom to postpartum.  Baby to Couplet care / Skin to Skin.  [employer]be [employer] A Dabid [place] 12/05/2020, 3:46 AM

## 2020-09-20 ENCOUNTER — Encounter (INDEPENDENT_AMBULATORY_CARE_PROVIDER_SITE_OTHER): Payer: Self-pay

## 2020-09-20 ENCOUNTER — Encounter (HOSPITAL_COMMUNITY): Payer: Self-pay | Admitting: Obstetrics and Gynecology

## 2020-09-20 ENCOUNTER — Inpatient Hospital Stay (HOSPITAL_COMMUNITY)
Admission: AD | Admit: 2020-09-20 | Discharge: 2020-09-20 | Disposition: A | Discharge status: Home / Self Care | Payer: BC Managed Care – PPO | Attending: Obstetrics and Gynecology | Admitting: Obstetrics and Gynecology

## 2020-09-20 ENCOUNTER — Other Ambulatory Visit: Payer: Self-pay

## 2020-09-20 DIAGNOSIS — O10913 Unspecified pre-existing hypertension complicating pregnancy, third trimester: Secondary | ICD-10-CM | POA: Insufficient documentation

## 2020-09-20 DIAGNOSIS — O98513 Other viral diseases complicating pregnancy, third trimester: Secondary | ICD-10-CM | POA: Diagnosis not present

## 2020-09-20 DIAGNOSIS — U071 COVID-19: Secondary | ICD-10-CM | POA: Diagnosis not present

## 2020-09-20 DIAGNOSIS — J069 Acute upper respiratory infection, unspecified: Secondary | ICD-10-CM | POA: Diagnosis not present

## 2020-09-20 DIAGNOSIS — Z3A28 28 weeks gestation of pregnancy: Secondary | ICD-10-CM | POA: Insufficient documentation

## 2020-09-20 HISTORY — DX: Anemia, unspecified: D64.9

## 2020-09-20 HISTORY — DX: Morbid (severe) obesity due to excess calories: E66.01

## 2020-09-20 HISTORY — DX: Obesity complicating pregnancy, unspecified trimester: O99.210

## 2020-09-20 LAB — CBC WITH DIFFERENTIAL/PLATELET
Abs Immature Granulocytes: 0.07 10*3/uL (ref 0.00–0.07)
Basophils Absolute: 0 10*3/uL (ref 0.0–0.1)
Basophils Relative: 0 %
Eosinophils Absolute: 0.1 10*3/uL (ref 0.0–0.5)
Eosinophils Relative: 1 %
HCT: 33.1 % — ABNORMAL LOW (ref 36.0–46.0)
Hemoglobin: 11.2 g/dL — ABNORMAL LOW (ref 12.0–15.0)
Immature Granulocytes: 1 %
Lymphocytes Relative: 8 %
Lymphs Abs: 1.1 10*3/uL (ref 0.7–4.0)
MCH: 32 pg (ref 26.0–34.0)
MCHC: 33.8 g/dL (ref 30.0–36.0)
MCV: 94.6 fL (ref 80.0–100.0)
Monocytes Absolute: 0.4 10*3/uL (ref 0.1–1.0)
Monocytes Relative: 3 %
Neutro Abs: 11.2 10*3/uL — ABNORMAL HIGH (ref 1.7–7.7)
Neutrophils Relative %: 87 %
Platelets: 277 10*3/uL (ref 150–400)
RBC: 3.5 MIL/uL — ABNORMAL LOW (ref 3.87–5.11)
RDW: 13.3 % (ref 11.5–15.5)
WBC: 12.8 10*3/uL — ABNORMAL HIGH (ref 4.0–10.5)
nRBC: 0 % (ref 0.0–0.2)

## 2020-09-20 LAB — PROTEIN / CREATININE RATIO, URINE
Creatinine, Urine: 493.95 mg/dL
Protein Creatinine Ratio: 0.06 mg/mg{Cre} (ref 0.00–0.15)
Total Protein, Urine: 32 mg/dL

## 2020-09-20 LAB — RESP PANEL BY RT-PCR (FLU A&B, COVID) ARPGX2
Influenza A by PCR: NEGATIVE
Influenza B by PCR: NEGATIVE
SARS Coronavirus 2 by RT PCR: POSITIVE — AB

## 2020-09-20 LAB — COMPREHENSIVE METABOLIC PANEL
ALT: 23 U/L (ref 0–44)
AST: 26 U/L (ref 15–41)
Albumin: 2.8 g/dL — ABNORMAL LOW (ref 3.5–5.0)
Alkaline Phosphatase: 59 U/L (ref 38–126)
Anion gap: 10 (ref 5–15)
BUN: 5 mg/dL — ABNORMAL LOW (ref 6–20)
CO2: 20 mmol/L — ABNORMAL LOW (ref 22–32)
Calcium: 8.8 mg/dL — ABNORMAL LOW (ref 8.9–10.3)
Chloride: 103 mmol/L (ref 98–111)
Creatinine, Ser: 0.89 mg/dL (ref 0.44–1.00)
GFR, Estimated: >60 mL/min (ref >60)
Glucose, Bld: 95 mg/dL (ref 70–99)
Potassium: 3.5 mmol/L (ref 3.5–5.1)
Sodium: 133 mmol/L — ABNORMAL LOW (ref 135–145)
Total Bilirubin: 0.7 mg/dL (ref 0.3–1.2)
Total Protein: 7 g/dL (ref 6.5–8.1)

## 2020-09-20 MED ORDER — GUAIFENESIN 100 MG/5ML PO SOLN
5.0000 mL | Freq: Once | ORAL | Status: AC
  Administered 2020-09-20: 100 mg via ORAL
  Filled 2020-09-20: qty 15

## 2020-09-20 MED ORDER — ACETAMINOPHEN 500 MG PO TABS
1000.0000 mg | ORAL_TABLET | Freq: Once | ORAL | Status: AC
  Administered 2020-09-20: 1000 mg via ORAL
  Filled 2020-09-20: qty 2

## 2020-09-20 NOTE — MAU Note (Signed)
Pt is 30yr old G4P0 at 28.3 weeks presents with covid symptoms. Non productive cough and low grade temp at home 99.1. Denies body aches, chills nausea, vomiting, loss of taste or smell. Denies headache. Covid swab collected. Pts husband with covid test pending

## 2020-09-20 NOTE — Discharge Instructions (Signed)
Increase your fluid intake, continue with fetal monitoring/kick count/ OTC robitussin. May also use magnesium/zinc combination( see amazon). Preterm labor precautions 10 Things You Can Do to Manage Your COVID-19 Symptoms at Home If you have possible or confirmed COVID-19: 1. Stay home from work and school. And stay away from other public places. If you must go out, avoid using any kind of public transportation, ridesharing, or taxis. 2. Monitor your symptoms carefully. If your symptoms get worse, call your healthcare provider immediately. 3. Get rest and stay hydrated. 4. If you have a medical appointment, call the healthcare provider ahead of time and tell them that you have or may have COVID-19. 5. For medical emergencies, call 911 and notify the dispatch personnel that you have or may have COVID-19. 6. Cover your cough and sneezes with a tissue or use the inside of your elbow. 7. Wash your hands often with soap and water for at least 20 seconds or clean your hands with an alcohol-based hand sanitizer that contains at least 60% alcohol. 8. As much as possible, stay in a specific room and away from other people in your home. Also, you should use a separate bathroom, if available. If you need to be around other people in or outside of the home, wear a mask. 9. Avoid sharing personal items with other people in your household, like dishes, towels, and bedding. 10. Clean all surfaces that are touched often, like counters, tabletops, and doorknobs. Use household cleaning sprays or wipes according to the label instructions. [employer] 03/20/2019 This information is not intended to replace advice given to you by your health care provider. Make sure you discuss any questions you have with your health care provider. Document Revised: 08/22/2019 Document Reviewed: 08/22/2019 Elsevier Patient Education  2020 Elsevier Inc.   COVID-19 COVID-19 is a respiratory infection that is caused by a virus  called severe acute respiratory syndrome coronavirus 2 (SARS-CoV-2). The disease is also known as coronavirus disease or novel coronavirus. In some people, the virus may not cause any symptoms. In others, it may cause a serious infection. The infection can get worse quickly and can lead to complications, such as:  Pneumonia, or infection of the lungs.  Acute respiratory distress syndrome or ARDS. This is a condition in which fluid build-up in the lungs prevents the lungs from filling with air and passing oxygen into the blood.  Acute respiratory failure. This is a condition in which there is not enough oxygen passing from the lungs to the body or when carbon dioxide is not passing from the lungs out of the body.  Sepsis or septic shock. This is a serious bodily reaction to an infection.  Blood clotting problems.  Secondary infections due to bacteria or fungus.  Organ failure. This is when your body's organs stop working. The virus that causes COVID-19 is contagious. This means that it can spread from person to person through droplets from coughs and sneezes (respiratory secretions). What are the causes? This illness is caused by a virus. You may catch the virus by:  Breathing in droplets from an infected person. Droplets can be spread by a person breathing, speaking, singing, coughing, or sneezing.  Touching something, like a table or a doorknob, that was exposed to the virus (contaminated) and then touching your mouth, nose, or eyes. What increases the risk? Risk for infection You are more likely to be infected with this virus if you:  Are within 6 feet (2 meters) of a person with COVID-19.  Provide care for or live with a person who is infected with COVID-19.  Spend time in crowded indoor spaces or live in shared housing. Risk for serious illness You are more likely to become seriously ill from the virus if you:  Are 13 years of age or older. The higher your age, the more you are  at risk for serious illness.  Live in a nursing home or long-term care facility.  Have cancer.  Have a long-term (chronic) disease such as: ? Chronic lung disease, including chronic obstructive pulmonary disease or asthma. ? A long-term disease that lowers your body's ability to fight infection (immunocompromised). ? Heart disease, including heart failure, a condition in which the arteries that lead to the heart become narrow or blocked (coronary artery disease), a disease which makes the heart muscle thick, weak, or stiff (cardiomyopathy). ? Diabetes. ? Chronic kidney disease. ? Sickle cell disease, a condition in which red blood cells have an abnormal "sickle" shape. ? Liver disease.  Are obese. What are the signs or symptoms? Symptoms of this condition can range from mild to severe. Symptoms may appear any time from 2 to 14 days after being exposed to the virus. They include:  A fever or chills.  A cough.  Difficulty breathing.  Headaches, body aches, or muscle aches.  Runny or stuffy (congested) nose.  A sore throat.  New loss of taste or smell. Some people may also have stomach problems, such as nausea, vomiting, or diarrhea. Other people may not have any symptoms of COVID-19. How is this diagnosed? This condition may be diagnosed based on:  Your signs and symptoms, especially if: ? You live in an area with a COVID-19 outbreak. ? You recently traveled to or from an area where the virus is common. ? You provide care for or live with a person who was diagnosed with COVID-19. ? You were exposed to a person who was diagnosed with COVID-19.  A physical exam.  Lab tests, which may include: ? Taking a sample of fluid from the back of your nose and throat (nasopharyngeal fluid), your nose, or your throat using a swab. ? A sample of mucus from your lungs (sputum). ? Blood tests.  Imaging tests, which may include, X-rays, CT scan, or ultrasound. How is this treated? At  present, there is no medicine to treat COVID-19. Medicines that treat other diseases are being used on a trial basis to see if they are effective against COVID-19. Your health care provider will talk with you about ways to treat your symptoms. For most people, the infection is mild and can be managed at home with rest, fluids, and over-the-counter medicines. Treatment for a serious infection usually takes places in a hospital intensive care unit (ICU). It may include one or more of the following treatments. These treatments are given until your symptoms improve.  Receiving fluids and medicines through an IV.  Supplemental oxygen. Extra oxygen is given through a tube in the nose, a face mask, or a hood.  Positioning you to lie on your stomach (prone position). This makes it easier for oxygen to get into the lungs.  Continuous positive airway pressure (CPAP) or bi-level positive airway pressure (BPAP) machine. This treatment uses mild air pressure to keep the airways open. A tube that is connected to a motor delivers oxygen to the body.  Ventilator. This treatment moves air into and out of the lungs by using a tube that is placed in your windpipe.  Tracheostomy. This  is a procedure to create a hole in the neck so that a breathing tube can be inserted.  Extracorporeal membrane oxygenation (ECMO). This procedure gives the lungs a chance to recover by taking over the functions of the heart and lungs. It supplies oxygen to the body and removes carbon dioxide. Follow these instructions at home: Lifestyle  If you are sick, stay home except to get medical care. Your health care provider will tell you how long to stay home. Call your health care provider before you go for medical care.  Rest at home as told by your health care provider.  Do not use any products that contain nicotine or tobacco, such as cigarettes, e-cigarettes, and chewing tobacco. If you need help quitting, ask your health care  provider.  Return to your normal activities as told by your health care provider. Ask your health care provider what activities are safe for you. General instructions  Take over-the-counter and prescription medicines only as told by your health care provider.  Drink enough fluid to keep your urine pale yellow.  Keep all follow-up visits as told by your health care provider. This is important. How is this prevented?  There is no vaccine to help prevent COVID-19 infection. However, there are steps you can take to protect yourself and others from this virus. To protect yourself:   Do not travel to areas where COVID-19 is a risk. The areas where COVID-19 is reported change often. To identify high-risk areas and travel restrictions, check the CDC travel website: StageSync.si  If you live in, or must travel to, an area where COVID-19 is a risk, take precautions to avoid infection. ? Stay away from people who are sick. ? Wash your hands often with soap and water for 20 seconds. If soap and water are not available, use an alcohol-based hand sanitizer. ? Avoid touching your mouth, face, eyes, or nose. ? Avoid going out in public, follow guidance from your state and local health authorities. ? If you must go out in public, wear a cloth face covering or face mask. Make sure your mask covers your nose and mouth. ? Avoid crowded indoor spaces. Stay at least 6 feet (2 meters) away from others. ? Disinfect objects and surfaces that are frequently touched every day. This may include:  Counters and tables.  Doorknobs and light switches.  Sinks and faucets.  Electronics, such as phones, remote controls, keyboards, computers, and tablets. To protect others: If you have symptoms of COVID-19, take steps to prevent the virus from spreading to others.  If you think you have a COVID-19 infection, contact your health care provider right away. Tell your health care team that you think you  may have a COVID-19 infection.  Stay home. Leave your house only to seek medical care. Do not use public transport.  Do not travel while you are sick.  Wash your hands often with soap and water for 20 seconds. If soap and water are not available, use alcohol-based hand sanitizer.  Stay away from other members of your household. Let healthy household members care for children and pets, if possible. If you have to care for children or pets, wash your hands often and wear a mask. If possible, stay in your own room, separate from others. Use a different bathroom.  Make sure that all people in your household wash their hands well and often.  Cough or sneeze into a tissue or your sleeve or elbow. Do not cough or sneeze into your  hand or into the air.  Wear a cloth face covering or face mask. Make sure your mask covers your nose and mouth. Where to find more information  Centers for Disease Control and Prevention: StickerEmporium.tn  World Health Organization: https://thompson-craig.com/ Contact a health care provider if:  You live in or have traveled to an area where COVID-19 is a risk and you have symptoms of the infection.  You have had contact with someone who has COVID-19 and you have symptoms of the infection. Get help right away if:  You have trouble breathing.  You have pain or pressure in your chest.  You have confusion.  You have bluish lips and fingernails.  You have difficulty waking from sleep.  You have symptoms that get worse. These symptoms may represent a serious problem that is an emergency. Do not wait to see if the symptoms will go away. Get medical help right away. Call your local emergency services (911 in the [place] Do not drive yourself to the hospital. Let the emergency medical personnel know if you think you have COVID-19. Summary  COVID-19 is a respiratory infection that is caused by a virus. It is also known as  coronavirus disease or novel coronavirus. It can cause serious infections, such as pneumonia, acute respiratory distress syndrome, acute respiratory failure, or sepsis.  The virus that causes COVID-19 is contagious. This means that it can spread from person to person through droplets from breathing, speaking, singing, coughing, or sneezing.  You are more likely to develop a serious illness if you are 57 years of age or older, have a weak immune system, live in a nursing home, or have chronic disease.  There is no medicine to treat COVID-19. Your health care provider will talk with you about ways to treat your symptoms.  Take steps to protect yourself and others from infection. Wash your hands often and disinfect objects and surfaces that are frequently touched every day. Stay away from people who are sick and wear a mask if you are sick. This information is not intended to replace advice given to you by your health care provider. Make sure you discuss any questions you have with your health care provider. Document Revised: 07/05/2019 Document Reviewed: 10/11/2018 Elsevier Patient Education  2020 [employer].

## 2020-09-20 NOTE — MAU Provider Note (Signed)
History     Chief Complaint  Patient presents with  . Cough  . Fever    Temp low grade 99.1   30 yo G4P0030 MF @ 28 3/[redacted] week gestation presents with c/o low grade fever and nonproductive cough. Pt went to a funeral with her husband who has a Covid testing pending. She has been Covid -19 ( phizer) but has not received the booster.  Denies SOB or palpitation. (+) FM.Chronic HTN  On baby ASA  OB History    Gravida  4   Para  0   Term  0   Preterm  0   AB  3   Living  0     SAB  0   IAB  0   Ectopic  0   Multiple  0   Live Births  0           No past medical history on file.  No past surgical history on file.  No family history on file.  Social History   Tobacco Use  . Smoking status: Never Smoker  . Smokeless tobacco: Never Used  Substance Use Topics  . Alcohol use: Yes  . Drug use: Not Currently    Allergies: No Known Allergies  Medications Prior to Admission  Medication Sig Dispense Refill Last Dose  . acetaminophen (TYLENOL) 500 MG tablet Take 1,000 mg by mouth every 6 (six) hours as needed (pain).     Marland Kitchen diclofenac (VOLTAREN) 75 MG EC tablet Take 1 tablet (75 mg total) by mouth 2 (two) times daily. (Patient not taking: Reported on 06/23/2019) 50 tablet 2   . metroNIDAZOLE (FLAGYL) 500 MG tablet Take 1 tablet (500 mg total) by mouth 2 (two) times daily. 14 tablet 0   . Multiple Vitamin (MULTIVITAMIN WITH MINERALS) TABS tablet Take 1 tablet by mouth every morning.     . naproxen sodium (ALEVE) 220 MG tablet Take 220-440 mg by mouth 2 (two) times daily as needed (pain).     . NONFORMULARY OR COMPOUNDED ITEM [facility]:  Antiinflammatory Cream - Diclofenac 3%, Baclofen 2%, Lidocaine 2%, apply 1-2 grams to affected area 3-4 times a day. (Patient not taking: Reported on 06/23/2019) 120 each 2      Physical Exam   Blood pressure (!) 141/65, pulse (!) 114, temperature (!) 100.5 F (38.1 C), temperature source Oral, resp. rate 20, height 5\' 7"   (1.702 m), weight (!) 156.5 kg.  General appearance: alert, cooperative and no distress Lungs: clear to auscultation bilaterally Heart: tachycardia Abdomen: obese soft nontender Extremities: no edema, redness or tenderness in the calves or thighs   Tracing baseline 150 NR (-) ctx ED Course  IMP: URI r/o Covid-19 infection Chronic HTN affecting pregnancy 3rd trim IUP @ 28 3/7 wk P) cbc with diff. PIH labs. Robitussin. Tylenol( 1g). Covid 19 testing  MDM   addendum CBC Latest Ref Rng & Units 09/20/2020 06/23/2019  WBC 4.0 - 10.5 K/uL 12.8(H) 9.9  Hemoglobin 12.0 - 15.0 g/dL 11.2(L) 12.0  Hematocrit 36.0 - 46.0 % 33.1(L) 39.2  Platelets 150 - 400 K/uL 277 401(H)   CMP Latest Ref Rng & Units 09/20/2020 06/23/2019  Glucose 70 - 99 mg/dL 95 96  BUN 6 - 20 mg/dL 5(L) 8  Creatinine 08/23/2019 - 1.00 mg/dL 2.22 9.79  Sodium 8.92 - 145 mmol/L 133(L) 133(L)  Potassium 3.5 - 5.1 mmol/L 3.5 3.9  Chloride 98 - 111 mmol/L 103 104  CO2 22 - 32 mmol/L 20(L) 23  Calcium 8.9 - 10.3 mg/dL 9.9(V) 4.4(Q)  Total Protein 6.5 - 8.1 g/dL 7.0 7.7  Total Bilirubin 0.3 - 1.2 mg/dL 0.7 0.6  Alkaline Phos 38 - 126 U/L 59 60  AST 15 - 41 U/L 26 16  ALT 0 - 44 U/L 23 18  PCR pending   Lab Results  Component Value Date   SARSCOV2NAA POSITIVE (A) 09/20/2020  Labs (+) Covid-19 No criteria for CXR P)d/c home. Quarantine or 10 days. Pt is not working. R/s office appt from 1/10. Put on schedule for monoclonal antibody infusion. Warning signs for worsening sx disc Serita Kyle, MD 4:49 PM 09/20/2020

## 2020-09-21 ENCOUNTER — Telehealth: Payer: Self-pay

## 2020-09-21 ENCOUNTER — Encounter (INDEPENDENT_AMBULATORY_CARE_PROVIDER_SITE_OTHER): Payer: Self-pay

## 2020-09-21 NOTE — Telephone Encounter (Signed)
Patient states that she has been drinking fluids but hasn't felt like eaten. Patient states that she is going to try and eat sometime. Patient advise on protocols as follow:   If appetite becomes worse: encourage patient to drink fluids as tolerated, work their way up to bland solid food such as crackers, pretzels, soup, bread or applesauce and boiled starches.   If patient is unable to tolerate any foods or liquids, notify PCP.   IF PATIENT DEVELOPS SEVERE VOMITING (MORE THAN 6 TIMES A DAY AND OR >8 HOURS) AND/OR SEVERE ABDOMINAL PAIN ADVISE PATIENT TO CALL 911 AND SEEK TREATMENT IN ED   IF PATIENT HAS WORSENING WEAKNESS WITH INABILITY TO STAND OR IF PATIENT HAS TO HOLD ON TO SOMETHING TO GET BALANCE, ADVISE PATIENT TO CALL 911 AND SEEK TREATMENT IN ED  Patient verbalized understanding and agrees with the plan.

## 2020-09-22 ENCOUNTER — Telehealth: Payer: Self-pay | Admitting: *Deleted

## 2020-09-22 NOTE — Telephone Encounter (Signed)
Patient called after receiving BPA via MyChart Questionnaire for worsening cough. Patient states that the cough has become productive today and the sputum is pale white in color. Patient states she is taking over the counter medications for cough. Patient instructed to continue to treat cough with over the counter medications and to drink warm fluids to help. Patient advised to contact PCP if cough does not improve or if she notices a change in color of the sputum. Understanding verbalized.

## 2020-10-02 ENCOUNTER — Encounter (HOSPITAL_COMMUNITY): Payer: Self-pay | Admitting: Obstetrics and Gynecology

## 2020-10-02 ENCOUNTER — Inpatient Hospital Stay (HOSPITAL_COMMUNITY)
Admission: AD | Admit: 2020-10-02 | Discharge: 2020-10-02 | Disposition: A | Discharge status: Home / Self Care | Payer: BC Managed Care – PPO | Attending: Obstetrics and Gynecology | Admitting: Obstetrics and Gynecology

## 2020-10-02 ENCOUNTER — Other Ambulatory Visit: Payer: Self-pay

## 2020-10-02 DIAGNOSIS — O26893 Other specified pregnancy related conditions, third trimester: Secondary | ICD-10-CM | POA: Insufficient documentation

## 2020-10-02 DIAGNOSIS — Z7982 Long term (current) use of aspirin: Secondary | ICD-10-CM | POA: Insufficient documentation

## 2020-10-02 DIAGNOSIS — U071 COVID-19: Secondary | ICD-10-CM | POA: Insufficient documentation

## 2020-10-02 DIAGNOSIS — E860 Dehydration: Secondary | ICD-10-CM

## 2020-10-02 DIAGNOSIS — R109 Unspecified abdominal pain: Secondary | ICD-10-CM | POA: Diagnosis present

## 2020-10-02 DIAGNOSIS — O26899 Other specified pregnancy related conditions, unspecified trimester: Secondary | ICD-10-CM

## 2020-10-02 DIAGNOSIS — O98513 Other viral diseases complicating pregnancy, third trimester: Secondary | ICD-10-CM | POA: Diagnosis not present

## 2020-10-02 DIAGNOSIS — Z3A30 30 weeks gestation of pregnancy: Secondary | ICD-10-CM | POA: Insufficient documentation

## 2020-10-02 DIAGNOSIS — O99280 Endocrine, nutritional and metabolic diseases complicating pregnancy, unspecified trimester: Secondary | ICD-10-CM

## 2020-10-02 LAB — URINALYSIS, ROUTINE W REFLEX MICROSCOPIC
Bilirubin Urine: NEGATIVE
Glucose, UA: NEGATIVE mg/dL
Hgb urine dipstick: NEGATIVE
Ketones, ur: NEGATIVE mg/dL
Leukocytes,Ua: NEGATIVE
Nitrite: NEGATIVE
Protein, ur: 30 mg/dL — AB
Specific Gravity, Urine: 1.025 (ref 1.005–1.030)
pH: 7 (ref 5.0–8.0)

## 2020-10-02 NOTE — MAU Note (Signed)
Pt reports around 3:30 she was sitting and started feeling a pain in her mid abd pain was constant and rated it around 7-8. She stood and walked around got a little better but when she sat it got worse again. Tried laying down pain was not as bad but then got feeling o f "lighting crotch" for a while. Pain is not as bad now but still there. Reports good fetal movement and denies any vag bleeding or leaking at this time.

## 2020-10-02 NOTE — MAU Provider Note (Signed)
History     Chief Complaint  Patient presents with   Abdominal Pain  30 yo G4P0030 MF @ 32 1/[redacted] weeks gestation presents with complaint of one episode of prolonged lower abdominal pain  That since resolved. (+) FM. Denies vaginal bleeding, leakage of fluid. Only drank one 16 oz glass of water today. Covid 19 positive and just finished quarantine   OB History     Gravida  4   Para  0   Term  0   Preterm  0   AB  3   Living  0      SAB  0   IAB  0   Ectopic  0   Multiple  0   Live Births  0           Past Medical History:  Diagnosis Date   Anemia    Morbid obesity (HCC)    Obesity affecting pregnancy     Past Surgical History:  Procedure Laterality Date   NO PAST SURGERIES      No family history on file.  Social History   Tobacco Use   Smoking status: Never Smoker   Smokeless tobacco: Never Used  Substance Use Topics   Alcohol use: Not Currently   Drug use: Not Currently    Allergies: No Known Allergies  Medications Prior to Admission  Medication Sig Dispense Refill Last Dose   acetaminophen (TYLENOL) 500 MG tablet Take 1,000 mg by mouth every 6 (six) hours as needed (pain).   Past Month at Unknown time   aspirin 81 MG chewable tablet Chew 81 mg by mouth daily.   10/02/2020 at Unknown time   ferrous sulfate 325 (65 FE) MG EC tablet Take 325 mg by mouth daily.   10/02/2020 at Unknown time   Multiple Vitamin (MULTIVITAMIN WITH MINERALS) TABS tablet Take 1 tablet by mouth every morning.   10/02/2020 at Unknown time     Physical Exam   Blood pressure 130/79, pulse 76, temperature 98.9 F (37.2 C), resp. rate 18, height 5\' 7"  (1.702 m), weight (!) 154.2 kg.  General appearance: alert, cooperative, and no distress Lungs: clear to auscultation bilaterally Heart: regular rate and rhythm, S1, S2 normal, no murmur, click, rub or gallop Abdomen:  soft obese non tender Pelvic: cervix normal in appearance, external genitalia normal, and  closed/firm/long/-3 vtx  Tracing ; baseline 150 NR c/w gestational age Uterine irritability Urinalysis    Component Value Date/Time   COLORURINE AMBER (A) 10/02/2020 1922   APPEARANCEUR CLOUDY (A) 10/02/2020 1922   LABSPEC 1.025 10/02/2020 1922   PHURINE 7.0 10/02/2020 1922   GLUCOSEU NEGATIVE 10/02/2020 1922   HGBUR NEGATIVE 10/02/2020 1922   BILIRUBINUR NEGATIVE 10/02/2020 1922   KETONESUR NEGATIVE 10/02/2020 1922   PROTEINUR 30 (A) 10/02/2020 1922   NITRITE NEGATIVE 10/02/2020 1922   LEUKOCYTESUR NEGATIVE 10/02/2020 1922    Ucx sent  ED Course  IMP: abdominal cramping pregnancy Dehyration affecting pregnancy IUP@ 30 wk P)  u/a, ucx. Stress need for oral hydration( 64 oz/d) d/c home. Keep sched OB appt. Call if sx recurs MDM   10/04/2020, MD 7:22 PM 10/02/2020

## 2020-10-02 NOTE — Discharge Instructions (Signed)
Dehydration, Adult Dehydration is condition in which there is not enough water or other fluids in the body. This happens when a person loses more fluids than he or she takes in. Important body parts cannot work right without the right amount of fluids. Any loss of fluids from the body can cause dehydration. Dehydration can be mild, worse, or very bad. It should be treated right away to keep it from getting very bad. What are the causes? This condition may be caused by:  Conditions that cause loss of water or other fluids, such as: ? Watery poop (diarrhea). ? Vomiting. ? Sweating a lot. ? Peeing (urinating) a lot.  Not drinking enough fluids, especially when you: ? Are ill. ? Are doing things that take a lot of energy to do.  Other illnesses and conditions, such as fever or infection.  Certain medicines, such as medicines that take extra fluid out of the body (diuretics).  Lack of safe drinking water.  Not being able to get enough water and food. What increases the risk? The following factors may make you more likely to develop this condition:  Having a long-term (chronic) illness that has not been treated the right way, such as: ? Diabetes. ? Heart disease. ? Kidney disease.  Being 65 years of age or older.  Having a disability.  Living in a place that is high above the ground or sea (high in altitude). The thinner, dried air causes more fluid loss.  Doing exercises that put stress on your body for a long time. What are the signs or symptoms? Symptoms of dehydration depend on how bad it is. Mild or worse dehydration  Thirst.  Dry lips or dry mouth.  Feeling dizzy or light-headed, especially when you stand up from sitting.  Muscle cramps.  Your body making: ? Dark pee (urine). Pee may be the color of tea. ? Less pee than normal. ? Less tears than normal.  Headache. Very bad dehydration  Changes in skin. Skin may: ? Be cold to the touch (clammy). ? Be blotchy  or pale. ? Not go back to normal right after you lightly pinch it and let it go.  Little or no tears, pee, or sweat.  Changes in vital signs, such as: ? Fast breathing. ? Low blood pressure. ? Weak pulse. ? Pulse that is more than 100 beats a minute when you are sitting still.  Other changes, such as: ? Feeling very thirsty. ? Eyes that look hollow (sunken). ? Cold hands and feet. ? Being mixed up (confused). ? Being very tired (lethargic) or having trouble waking from sleep. ? Short-term weight loss. ? Loss of consciousness. How is this treated? Treatment for this condition depends on how bad it is. Treatment should start right away. Do not wait until your condition gets very bad. Very bad dehydration is an emergency. You will need to go to a hospital.  Mild or worse dehydration can be treated at home. You may be asked to: ? Drink more fluids. ? Drink an oral rehydration solution (ORS). This drink helps get the right amounts of fluids and salts and minerals in the blood (electrolytes).  Very bad dehydration can be treated: ? With fluids through an IV tube. ? By getting normal levels of salts and minerals in your blood. This is often done by giving salts and minerals through a tube. The tube is passed through your nose and into your stomach. ? By treating the root cause. Follow these instructions at   home: Oral rehydration solution If told by your doctor, drink an ORS:  Make an ORS. Use instructions on the package.  Start by drinking small amounts, about  cup (120 mL) every 5-10 minutes.  Slowly drink more until you have had the amount that your doctor said to have. Eating and drinking  Drink enough clear fluid to keep your pee pale yellow. If you were told to drink an ORS, finish the ORS first. Then, start slowly drinking other clear fluids. Drink fluids such as: ? Water. Do not drink only water. Doing that can make the salt (sodium) level in your body get too low. ? Water  from ice chips you suck on. ? Fruit juice that you have added water to (diluted). ? Low-calorie sports drinks.  Eat foods that have the right amounts of salts and minerals, such as: ? Bananas. ? Oranges. ? Potatoes. ? Tomatoes. ? Spinach.  Do not drink alcohol.  Avoid: ? Drinks that have a lot of sugar. These include:  High-calorie sports drinks.  Fruit juice that you did not add water to.  Soda.  Caffeine. ? Foods that are greasy or have a lot of fat or sugar.         General instructions  Take over-the-counter and prescription medicines only as told by your doctor.  Do not take salt tablets. Doing that can make the salt level in your body get too high.  Return to your normal activities as told by your doctor. Ask your doctor what activities are safe for you.  Keep all follow-up visits as told by your doctor. This is important. Contact a doctor if:  You have pain in your belly (abdomen) and the pain: ? Gets worse. ? Stays in one place.  You have a rash.  You have a stiff neck.  You get angry or annoyed (irritable) more easily than normal.  You are more tired or have a harder time waking than normal.  You feel: ? Weak or dizzy. ? Very thirsty. Get help right away if you have:  Any symptoms of very bad dehydration.  Symptoms of vomiting, such as: ? You cannot eat or drink without vomiting. ? Your vomiting gets worse or does not go away. ? Your vomit has blood or green stuff in it.  Symptoms that get worse with treatment.  A fever.  A very bad headache.  Problems with peeing or pooping (having a bowel movement), such as: ? Watery poop that gets worse or does not go away. ? Blood in your poop (stool). This may cause poop to look black and tarry. ? Not peeing in 6-8 hours. ? Peeing only a small amount of very dark pee in 6-8 hours.  Trouble breathing. These symptoms may be an emergency. Do not wait to see if the symptoms will go away. Get  medical help right away. Call your local emergency services (911 in the [place] Do not drive yourself to the hospital. Summary  Dehydration is a condition in which there is not enough water or other fluids in the body. This happens when a person loses more fluids than he or she takes in.  Treatment for this condition depends on how bad it is. Treatment should be started right away. Do not wait until your condition gets very bad.  Drink enough clear fluid to keep your pee pale yellow. If you were told to drink an oral rehydration solution (ORS), finish the ORS first. Then, start slowly drinking other clear fluids.    Take over-the-counter and prescription medicines only as told by your doctor.  Get help right away if you have any symptoms of very bad dehydration. This information is not intended to replace advice given to you by your health care provider. Make sure you discuss any questions you have with your health care provider. Document Revised: 04/18/2019 Document Reviewed: 04/18/2019 Elsevier Patient Education  2021 [employer]  

## 2020-10-04 LAB — CULTURE, OB URINE
Culture: NO GROWTH
Special Requests: NORMAL

## 2020-12-01 ENCOUNTER — Encounter (HOSPITAL_COMMUNITY): Payer: Self-pay | Admitting: *Deleted

## 2020-12-01 ENCOUNTER — Telehealth (HOSPITAL_COMMUNITY): Payer: Self-pay | Admitting: *Deleted

## 2020-12-01 NOTE — Telephone Encounter (Signed)
Preadmission screen  

## 2020-12-03 ENCOUNTER — Other Ambulatory Visit: Payer: Self-pay | Admitting: Obstetrics and Gynecology

## 2020-12-04 ENCOUNTER — Inpatient Hospital Stay (HOSPITAL_COMMUNITY): Payer: BC Managed Care – PPO | Admitting: Anesthesiology

## 2020-12-04 ENCOUNTER — Inpatient Hospital Stay (HOSPITAL_COMMUNITY): Payer: BC Managed Care – PPO

## 2020-12-04 ENCOUNTER — Inpatient Hospital Stay (HOSPITAL_COMMUNITY)
Admission: AD | Admit: 2020-12-04 | Discharge: 2020-12-06 | DRG: 807 | Disposition: A | Discharge status: Home / Self Care | Payer: BC Managed Care – PPO | Attending: Obstetrics and Gynecology | Admitting: Obstetrics and Gynecology

## 2020-12-04 ENCOUNTER — Other Ambulatory Visit: Payer: Self-pay

## 2020-12-04 ENCOUNTER — Encounter (HOSPITAL_COMMUNITY): Payer: Self-pay | Admitting: Obstetrics and Gynecology

## 2020-12-04 DIAGNOSIS — Z8616 Personal history of COVID-19: Secondary | ICD-10-CM

## 2020-12-04 DIAGNOSIS — Z3A39 39 weeks gestation of pregnancy: Secondary | ICD-10-CM | POA: Diagnosis not present

## 2020-12-04 DIAGNOSIS — O43123 Velamentous insertion of umbilical cord, third trimester: Secondary | ICD-10-CM | POA: Diagnosis present

## 2020-12-04 DIAGNOSIS — O9081 Anemia of the puerperium: Secondary | ICD-10-CM | POA: Diagnosis not present

## 2020-12-04 DIAGNOSIS — O99214 Obesity complicating childbirth: Secondary | ICD-10-CM | POA: Diagnosis present

## 2020-12-04 DIAGNOSIS — O1002 Pre-existing essential hypertension complicating childbirth: Secondary | ICD-10-CM | POA: Diagnosis present

## 2020-12-04 DIAGNOSIS — O99824 Streptococcus B carrier state complicating childbirth: Secondary | ICD-10-CM | POA: Diagnosis present

## 2020-12-04 LAB — PROTEIN / CREATININE RATIO, URINE
Creatinine, Urine: 180.47 mg/dL
Protein Creatinine Ratio: 0.13 mg/mg{Cre} (ref 0.00–0.15)
Total Protein, Urine: 23 mg/dL

## 2020-12-04 LAB — COMPREHENSIVE METABOLIC PANEL
ALT: 17 U/L (ref 0–44)
AST: 22 U/L (ref 15–41)
Albumin: 2.7 g/dL — ABNORMAL LOW (ref 3.5–5.0)
Alkaline Phosphatase: 79 U/L (ref 38–126)
Anion gap: 8 (ref 5–15)
BUN: <5 mg/dL — ABNORMAL LOW (ref 6–20)
CO2: 21 mmol/L — ABNORMAL LOW (ref 22–32)
Calcium: 8.8 mg/dL — ABNORMAL LOW (ref 8.9–10.3)
Chloride: 105 mmol/L (ref 98–111)
Creatinine, Ser: 0.64 mg/dL (ref 0.44–1.00)
GFR, Estimated: >60 mL/min (ref >60)
Glucose, Bld: 89 mg/dL (ref 70–99)
Potassium: 4 mmol/L (ref 3.5–5.1)
Sodium: 134 mmol/L — ABNORMAL LOW (ref 135–145)
Total Bilirubin: 0.6 mg/dL (ref 0.3–1.2)
Total Protein: 6.8 g/dL (ref 6.5–8.1)

## 2020-12-04 LAB — CBC
HCT: 33.1 % — ABNORMAL LOW (ref 36.0–46.0)
HCT: 33.3 % — ABNORMAL LOW (ref 36.0–46.0)
Hemoglobin: 11.2 g/dL — ABNORMAL LOW (ref 12.0–15.0)
Hemoglobin: 11.6 g/dL — ABNORMAL LOW (ref 12.0–15.0)
MCH: 31.2 pg (ref 26.0–34.0)
MCH: 31.5 pg (ref 26.0–34.0)
MCHC: 33.8 g/dL (ref 30.0–36.0)
MCHC: 34.8 g/dL (ref 30.0–36.0)
MCV: 92.2 fL (ref 80.0–100.0)
MCV: 90.5 fL (ref 80.0–100.0)
Platelets: 314 10*3/uL (ref 150–400)
Platelets: 299 10*3/uL (ref 150–400)
RBC: 3.59 MIL/uL — ABNORMAL LOW (ref 3.87–5.11)
RBC: 3.68 MIL/uL — ABNORMAL LOW (ref 3.87–5.11)
RDW: 13.2 % (ref 11.5–15.5)
RDW: 13.2 % (ref 11.5–15.5)
WBC: 10.6 10*3/uL — ABNORMAL HIGH (ref 4.0–10.5)
WBC: 11.3 10*3/uL — ABNORMAL HIGH (ref 4.0–10.5)
nRBC: 0 % (ref 0.0–0.2)
nRBC: 0 % (ref 0.0–0.2)

## 2020-12-04 LAB — TYPE AND SCREEN
ABO/RH(D): O POS
Antibody Screen: NEGATIVE

## 2020-12-04 MED ORDER — PENICILLIN G POT IN DEXTROSE 60000 UNIT/ML IV SOLN: 3.0000 10*6.[IU] | INTRAVENOUS | Status: DC

## 2020-12-04 MED ORDER — SOD CITRATE-CITRIC ACID 500-334 MG/5ML PO SOLN
30.0000 mL | ORAL | Status: DC | PRN
Start: 2020-12-04 — End: 2020-12-05

## 2020-12-04 MED ORDER — ONDANSETRON HCL 4 MG/2ML IJ SOLN
4.0000 mg | Freq: Four times a day (QID) | INTRAMUSCULAR | Status: DC | PRN
  Administered 2020-12-04: 4 mg via INTRAVENOUS
  Filled 2020-12-04: qty 2

## 2020-12-04 MED ORDER — OXYCODONE-ACETAMINOPHEN 5-325 MG PO TABS: 1.0000 | ORAL_TABLET | ORAL | Status: DC | PRN

## 2020-12-04 MED ORDER — DIPHENHYDRAMINE HCL 50 MG/ML IJ SOLN: 12.5000 mg | INTRAMUSCULAR | Status: DC | PRN

## 2020-12-04 MED ORDER — EPHEDRINE 5 MG/ML INJ: 10.0000 mg | INTRAVENOUS | Status: DC | PRN

## 2020-12-04 MED ORDER — PENICILLIN G POT IN DEXTROSE 60000 UNIT/ML IV SOLN: 3.0000 10*6.[IU] | Freq: Once | INTRAVENOUS | Status: DC

## 2020-12-04 MED ORDER — TERBUTALINE SULFATE 1 MG/ML IJ SOLN: 0.2500 mg | Freq: Once | INTRAMUSCULAR | Status: DC | PRN

## 2020-12-04 MED ORDER — LIDOCAINE HCL (PF) 1 % IJ SOLN
30.0000 mL | INTRAMUSCULAR | Status: AC | PRN
  Administered 2020-12-05: 30 mL via SUBCUTANEOUS
  Filled 2020-12-04: qty 30

## 2020-12-04 MED ORDER — PENICILLIN G POT IN DEXTROSE 60000 UNIT/ML IV SOLN
3.0000 10*6.[IU] | INTRAVENOUS | Status: DC
  Administered 2020-12-04 – 2020-12-05 (×3): 3 10*6.[IU] via INTRAVENOUS
  Filled 2020-12-04 (×3): qty 50

## 2020-12-04 MED ORDER — PHENYLEPHRINE 40 MCG/ML (10ML) SYRINGE FOR IV PUSH (FOR BLOOD PRESSURE SUPPORT)
80.0000 ug | PREFILLED_SYRINGE | INTRAVENOUS | Status: DC | PRN
  Administered 2020-12-05 (×2): 80 ug via INTRAVENOUS

## 2020-12-04 MED ORDER — ACETAMINOPHEN 325 MG PO TABS: 650.0000 mg | ORAL_TABLET | ORAL | Status: DC | PRN

## 2020-12-04 MED ORDER — FENTANYL-BUPIVACAINE-NACL 0.5-0.125-0.9 MG/250ML-% EP SOLN
12.0000 mL/h | EPIDURAL | Status: DC | PRN
  Administered 2020-12-04: 12 mL/h via EPIDURAL
  Filled 2020-12-04: qty 250

## 2020-12-04 MED ORDER — OXYTOCIN 10 UNIT/ML IJ SOLN: 10.0000 [IU] | Freq: Once | INTRAMUSCULAR | Status: DC

## 2020-12-04 MED ORDER — MISOPROSTOL 50MCG HALF TABLET
50.0000 ug | ORAL_TABLET | ORAL | Status: DC
  Administered 2020-12-04: 50 ug via ORAL
  Filled 2020-12-04: qty 1

## 2020-12-04 MED ORDER — LACTATED RINGERS IV SOLN: INTRAVENOUS | Status: DC

## 2020-12-04 MED ORDER — OXYCODONE-ACETAMINOPHEN 5-325 MG PO TABS: 2.0000 | ORAL_TABLET | ORAL | Status: DC | PRN

## 2020-12-04 MED ORDER — PHENYLEPHRINE 40 MCG/ML (10ML) SYRINGE FOR IV PUSH (FOR BLOOD PRESSURE SUPPORT)
80.0000 ug | PREFILLED_SYRINGE | INTRAVENOUS | Status: DC | PRN
  Administered 2020-12-05: 80 ug via INTRAVENOUS
  Filled 2020-12-04: qty 10

## 2020-12-04 MED ORDER — OXYTOCIN BOLUS FROM INFUSION
333.0000 mL | Freq: Once | INTRAVENOUS | Status: AC
  Administered 2020-12-05: 333 mL via INTRAVENOUS

## 2020-12-04 MED ORDER — FENTANYL CITRATE (PF) 100 MCG/2ML IJ SOLN
100.0000 ug | INTRAMUSCULAR | Status: DC | PRN
  Administered 2020-12-04 (×2): 100 ug via INTRAVENOUS
  Filled 2020-12-04 (×2): qty 2

## 2020-12-04 MED ORDER — LACTATED RINGERS IV SOLN
500.0000 mL | Freq: Once | INTRAVENOUS | Status: AC
  Administered 2020-12-04: 500 mL via INTRAVENOUS

## 2020-12-04 MED ORDER — SODIUM CHLORIDE 0.9 % IV SOLN
5.0000 10*6.[IU] | Freq: Once | INTRAVENOUS | Status: AC
  Administered 2020-12-04: 5 10*6.[IU] via INTRAVENOUS
  Filled 2020-12-04: qty 5

## 2020-12-04 MED ORDER — OXYTOCIN-SODIUM CHLORIDE 30-0.9 UT/500ML-% IV SOLN
1.0000 m[IU]/min | INTRAVENOUS | Status: DC
  Administered 2020-12-04: 2 m[IU]/min via INTRAVENOUS
  Filled 2020-12-04: qty 500

## 2020-12-04 MED ORDER — LIDOCAINE-EPINEPHRINE (PF) 2 %-1:200000 IJ SOLN
INTRAMUSCULAR | Status: DC | PRN
  Administered 2020-12-04: 4 mL via EPIDURAL

## 2020-12-04 MED ORDER — OXYTOCIN-SODIUM CHLORIDE 30-0.9 UT/500ML-% IV SOLN: 2.5000 [IU]/h | INTRAVENOUS | Status: DC

## 2020-12-04 MED ORDER — LACTATED RINGERS IV SOLN
500.0000 mL | INTRAVENOUS | Status: DC | PRN
Start: 2020-12-04 — End: 2020-12-05
  Administered 2020-12-05: 500 mL via INTRAVENOUS

## 2020-12-04 NOTE — H&P (Signed)
Kelsey Ellison is a 30 y.o. female presenting @ term for IOL due to chronic HTN OB History    Gravida  4   Para  0   Term  0   Preterm  0   AB  3   Living  0     SAB  0   IAB  0   Ectopic  0   Multiple  0   Live Births  0          Past Medical History:  Diagnosis Date  . Anemia   . Morbid obesity (HCC)   . Obesity affecting pregnancy    Past Surgical History:  Procedure Laterality Date  . NO PAST SURGERIES     Family History: family history is not on file. Social History:  reports that she has never smoked. She has never used smokeless tobacco. She reports previous alcohol use. She reports previous drug use.     Maternal Diabetes: No Genetic Screening: Normal Maternal Ultrasounds/Referrals: Normal Fetal Ultrasounds or other Referrals:  None Maternal Substance Abuse:  No Significant Maternal Medications:  Meds include: Other: baby asa Significant Maternal Lab Results:  Group B Strep positive Other Comments:  chronic HTN, hx Covid 19 positive, morbid obesity hx recurrent preg losses  Review of Systems  All other systems reviewed and are negative.  History Dilation: 1 Effacement (%): 50 Station: -3 Exam by:: stone rnc Blood pressure 132/70, pulse 73, temperature 98.4 F (36.9 C), temperature source Oral, resp. rate 16, height 5\' 7"  (1.702 m), weight (!) 159.7 kg. Maternal Exam:  Introitus: Normal vulva.   Physical Exam Constitutional:      Appearance: Normal appearance. She is obese.  Eyes:     Extraocular Movements: Extraocular movements intact.  Cardiovascular:     Rate and Rhythm: Regular rhythm.     Heart sounds: Normal heart sounds.  Pulmonary:     Breath sounds: Normal breath sounds.  Abdominal:     Palpations: Abdomen is soft.  Genitourinary:    General: Normal vulva.     Comments: 1/50/-3 Musculoskeletal:        General: No swelling.     Cervical back: Neck supple.  Skin:    General: Skin is warm and dry.     Findings:  Lesion present.  Neurological:     Mental Status: She is alert.  Psychiatric:        Mood and Affect: Mood normal.        Behavior: Behavior normal.     Prenatal labs: ABO, Rh: --/--/O POS (03/18 1310) Antibody: NEG (03/18 1310) Rubella: Immune (11/18 0000) RPR: Nonreactive (10/08 0000)  HBsAg: Negative (10/08 0000)  HIV: Non-reactive (10/08 0000)  GBS: Positive/-- (10/08 0000)   Assessment/Plan: Chronic HTN ( 3rd trimester) Term gestation GBS cx (+) Hx Covid 19 positive P) admit routine labs. PIH labs. Oral cytotec. IV PCN Kelsey Ellison 12/04/2020, 5:26 PM

## 2020-12-04 NOTE — Progress Notes (Signed)
S: s/p oral cytotec x 1 (+) cramping  O:  Patient Vitals for the past 24 hrs:  BP Temp Temp src Pulse Resp Height Weight  12/04/20 1433 132/70 - - 73 16 - -  12/04/20 1320 - 98.4 F (36.9 C) Oral - 18 - -  12/04/20 1317 134/80 - - 87 - - -  12/04/20 1301 - - - - - 5\' 7"  (1.702 m) (!) 159.7 kg  12/04/20 1254 (!) 135/101 - - 92 - - -  VE 1/80/-2 deviated to left Intracervical balloon placed  Tracing: baseline 145-150 (+) variability. Rare variable decel Ctx ( couplets, triplets)  CBC Latest Ref Rng & Units 12/04/2020 09/20/2020 06/23/2019  WBC 4.0 - 10.5 K/uL 10.6(H) 12.8(H) 9.9  Hemoglobin 12.0 - 15.0 g/dL 11.2(L) 11.2(L) 12.0  Hematocrit 36.0 - 46.0 % 33.1(L) 33.1(L) 39.2  Platelets 150 - 400 K/uL 314 277 401(H)   CMP Latest Ref Rng & Units 12/04/2020 09/20/2020 06/23/2019  Glucose 70 - 99 mg/dL 89 95 96  BUN 6 - 20 mg/dL 08/23/2019) 5(L) 8  Creatinine 0.44 - 1.00 mg/dL <4(T 6.54 6.50  Sodium 135 - 145 mmol/L 134(L) 133(L) 133(L)  Potassium 3.5 - 5.1 mmol/L 4.0 3.5 3.9  Chloride 98 - 111 mmol/L 105 103 104  CO2 22 - 32 mmol/L 21(L) 20(L) 23  Calcium 8.9 - 10.3 mg/dL 3.54) 6.5(K) 8.1(E)  Total Protein 6.5 - 8.1 g/dL 6.8 7.0 7.7  Total Bilirubin 0.3 - 1.2 mg/dL 0.6 0.7 0.6  Alkaline Phos 38 - 126 U/L 79 59 60  AST 15 - 41 U/L 22 26 16   ALT 0 - 44 U/L 17 23 18   PCR: 0.13  IMP: latent phase Chronic HTN w/o superimposed preeclampsia Term gestation GBS cx(+) on IV PCN Covid 19 positive in the past P) left exaggerated sims. Analgesic prn. Low dose pitocin. Defer amniotomy

## 2020-12-04 NOTE — Anesthesia Preprocedure Evaluation (Signed)
Anesthesia Evaluation  Patient identified by MRN, date of birth, ID band Patient awake    Reviewed: Allergy & Precautions, NPO status , Patient's Chart, lab work & pertinent test results  Airway Mallampati: III  TM Distance: >3 FB Neck ROM: Full    Dental no notable dental hx.    Pulmonary neg pulmonary ROS,    Pulmonary exam normal breath sounds clear to auscultation       Cardiovascular hypertension, Normal cardiovascular exam Rhythm:Regular Rate:Normal     Neuro/Psych negative neurological ROS  negative psych ROS   GI/Hepatic negative GI ROS, Neg liver ROS,   Endo/Other  Morbid obesity (BMI 55)  Renal/GU negative Renal ROS  negative genitourinary   Musculoskeletal negative musculoskeletal ROS (+)   Abdominal   Peds  Hematology negative hematology ROS (+)   Anesthesia Other Findings IOL for cHTN  Reproductive/Obstetrics (+) Pregnancy                             Anesthesia Physical Anesthesia Plan  ASA: III  Anesthesia Plan: Epidural   Post-op Pain Management:    Induction:   PONV Risk Score and Plan: Treatment may vary due to age or medical condition  Airway Management Planned: Natural Airway  Additional Equipment:   Intra-op Plan:   Post-operative Plan:   Informed Consent: I have reviewed the patients History and Physical, chart, labs and discussed the procedure including the risks, benefits and alternatives for the proposed anesthesia with the patient or authorized representative who has indicated his/her understanding and acceptance.       Plan Discussed with: Anesthesiologist  Anesthesia Plan Comments: (Patient identified. Risks, benefits, options discussed with patient including but not limited to bleeding, infection, nerve damage, paralysis, failed block, incomplete pain control, headache, blood pressure changes, nausea, vomiting, reactions to medication,  itching, and post partum back pain. Confirmed with bedside nurse the patient's most recent platelet count. Confirmed with the patient that they are not taking any anticoagulation, have any bleeding history or any family history of bleeding disorders. Patient expressed understanding and wishes to proceed. All questions were answered. )        Anesthesia Quick Evaluation

## 2020-12-04 NOTE — Anesthesia Procedure Notes (Signed)
Epidural Patient location during procedure: OB Start time: 12/04/2020 9:55 PM End time: 12/04/2020 10:05 PM  Staffing Anesthesiologist: Elmer Picker, MD Performed: anesthesiologist   Preanesthetic Checklist Completed: patient identified, IV checked, risks and benefits discussed, monitors and equipment checked, pre-op evaluation and timeout performed  Epidural Patient position: sitting Prep: DuraPrep and site prepped and draped Patient monitoring: continuous pulse ox, blood pressure, heart rate and cardiac monitor Approach: midline Location: L3-L4 Injection technique: LOR air  Needle:  Needle type: Tuohy  Needle gauge: 17 G Needle length: 9 cm Needle insertion depth: 10 cm Catheter type: closed end flexible Catheter size: 19 Gauge Catheter at skin depth: 16 cm Test dose: negative  Assessment Sensory level: T8 Events: blood not aspirated, injection not painful, no injection resistance, no paresthesia and negative IV test  Additional Notes Patient identified. Risks/Benefits/Options discussed with patient including but not limited to bleeding, infection, nerve damage, paralysis, failed block, incomplete pain control, headache, blood pressure changes, nausea, vomiting, reactions to medication both or allergic, itching and postpartum back pain. Confirmed with bedside nurse the patient's most recent platelet count. Confirmed with patient that they are not currently taking any anticoagulation, have any bleeding history or any family history of bleeding disorders. Patient expressed understanding and wished to proceed. All questions were answered. Sterile technique was used throughout the entire procedure. Please see nursing notes for vital signs. Test dose was given through epidural catheter and negative prior to continuing to dose epidural or start infusion. Warning signs of high block given to the patient including shortness of breath, tingling/numbness in hands, complete motor block,  or any concerning symptoms with instructions to call for help. Patient was given instructions on fall risk and not to get out of bed. All questions and concerns addressed with instructions to call with any issues or inadequate analgesia.  Reason for block:procedure for pain

## 2020-12-05 ENCOUNTER — Encounter (HOSPITAL_COMMUNITY): Payer: Self-pay | Admitting: Obstetrics and Gynecology

## 2020-12-05 LAB — CBC
HCT: 29.8 % — ABNORMAL LOW (ref 36.0–46.0)
Hemoglobin: 10.3 g/dL — ABNORMAL LOW (ref 12.0–15.0)
MCH: 31.3 pg (ref 26.0–34.0)
MCHC: 34.6 g/dL (ref 30.0–36.0)
MCV: 90.6 fL (ref 80.0–100.0)
Platelets: 288 10*3/uL (ref 150–400)
RBC: 3.29 MIL/uL — ABNORMAL LOW (ref 3.87–5.11)
RDW: 13.2 % (ref 11.5–15.5)
WBC: 16.3 10*3/uL — ABNORMAL HIGH (ref 4.0–10.5)
nRBC: 0 % (ref 0.0–0.2)

## 2020-12-05 LAB — RPR: RPR Ser Ql: NONREACTIVE

## 2020-12-05 MED ORDER — ONDANSETRON HCL 4 MG/2ML IJ SOLN: 4.0000 mg | INTRAMUSCULAR | Status: DC | PRN

## 2020-12-05 MED ORDER — DIBUCAINE (PERIANAL) 1 % EX OINT
1.0000 | TOPICAL_OINTMENT | CUTANEOUS | Status: DC | PRN
Start: 2020-12-05 — End: 2020-12-06

## 2020-12-05 MED ORDER — FERROUS SULFATE 325 (65 FE) MG PO TABS
325.0000 mg | ORAL_TABLET | Freq: Two times a day (BID) | ORAL | Status: DC
  Administered 2020-12-05 – 2020-12-06 (×3): 325 mg via ORAL
  Filled 2020-12-05 (×3): qty 1

## 2020-12-05 MED ORDER — OXYCODONE HCL 5 MG PO TABS: 5.0000 mg | ORAL_TABLET | ORAL | Status: DC | PRN

## 2020-12-05 MED ORDER — SENNOSIDES-DOCUSATE SODIUM 8.6-50 MG PO TABS
2.0000 | ORAL_TABLET | Freq: Every day | ORAL | Status: DC
  Administered 2020-12-06: 2 via ORAL
  Filled 2020-12-05: qty 2

## 2020-12-05 MED ORDER — WITCH HAZEL-GLYCERIN EX PADS: 1.0000 "application " | MEDICATED_PAD | CUTANEOUS | Status: DC | PRN

## 2020-12-05 MED ORDER — ONDANSETRON HCL 4 MG PO TABS: 4.0000 mg | ORAL_TABLET | ORAL | Status: DC | PRN

## 2020-12-05 MED ORDER — PRENATAL MULTIVITAMIN CH
1.0000 | ORAL_TABLET | Freq: Every day | ORAL | Status: DC
  Administered 2020-12-05 – 2020-12-06 (×2): 1 via ORAL
  Filled 2020-12-05 (×2): qty 1

## 2020-12-05 MED ORDER — ACETAMINOPHEN 325 MG PO TABS: 650.0000 mg | ORAL_TABLET | ORAL | Status: DC | PRN

## 2020-12-05 MED ORDER — DIPHENHYDRAMINE HCL 25 MG PO CAPS: 25.0000 mg | ORAL_CAPSULE | Freq: Four times a day (QID) | ORAL | Status: DC | PRN

## 2020-12-05 MED ORDER — LACTATED RINGERS AMNIOINFUSION: INTRAVENOUS | Status: DC

## 2020-12-05 MED ORDER — OXYCODONE HCL 5 MG PO TABS: 10.0000 mg | ORAL_TABLET | ORAL | Status: DC | PRN

## 2020-12-05 MED ORDER — COCONUT OIL OIL
1.0000 "application " | TOPICAL_OIL | Status: DC | PRN
  Administered 2020-12-05: 1 via TOPICAL

## 2020-12-05 MED ORDER — IBUPROFEN 600 MG PO TABS
600.0000 mg | ORAL_TABLET | Freq: Four times a day (QID) | ORAL | Status: DC
  Administered 2020-12-05 – 2020-12-06 (×6): 600 mg via ORAL
  Filled 2020-12-05 (×6): qty 1

## 2020-12-05 MED ORDER — BENZOCAINE-MENTHOL 20-0.5 % EX AERO
1.0000 "application " | INHALATION_SPRAY | CUTANEOUS | Status: DC | PRN
  Filled 2020-12-05: qty 56

## 2020-12-05 MED ORDER — ZOLPIDEM TARTRATE 5 MG PO TABS: 5.0000 mg | ORAL_TABLET | Freq: Every evening | ORAL | Status: DC | PRN

## 2020-12-05 MED ORDER — SIMETHICONE 80 MG PO CHEW: 80.0000 mg | CHEWABLE_TABLET | ORAL | Status: DC | PRN

## 2020-12-05 NOTE — Progress Notes (Signed)
Up date Pt notes pain with ctx  epidural dosing done  amnioinfusion maintenance dose in place  Tracing reviewed : baseline 140 (+) variable decel Ctx q 2-4 mins (resting tone of 20 MVU Ctx 20--40 MVU each  IMP: Variables due to cord compression Mgmt; amnioinfusion Active phase Term gestation GBS cx (+) on IV PCN P) resume pitocin at 4 miu. Cont close fetal monitoring

## 2020-12-05 NOTE — Lactation Note (Signed)
This note was copied from a baby's chart. Lactation Consultation Note  Patient Name: Kelsey Ellison [id number] Date: 12/05/2020 Reason for consult: Initial assessment;Mother's request;Primapara;1st time breastfeeding;Term;Other (Comment) (Gest HTN, Anemia) Age:32 hours  LC assisted Mom latching infant in a football position. Mom has large pendulous breasts. LC also reviewed with Mom how to do sideline position as well given her large breasts.   Mom denied any pain with the latch. Nipples are erect and no signs of compression or trauma.  Mom has electric pump at home.   Plan 1. To feed based on cues 8-12x in 24 hr no more than 3 hrs without an attempt. Mom to offer both breasts and look for swallows at the end of the feed.          2. Mom to offer EBM via hand expression with spoon or finger if unable to get infant to latch.          3 I and O sheet reviewed.          4 LC brochure of inpatient and outpatient services reviewed.  All questions answered at the end of the visit.   Maternal Data Has patient been taught Hand Expression?: Yes Does the patient have breastfeeding experience prior to this delivery?: No  Feeding Mother's Current Feeding Choice: Breast Milk  LATCH Score Latch: Grasps breast easily, tongue down, lips flanged, rhythmical sucking.  Audible Swallowing: Spontaneous and intermittent  Type of Nipple: Everted at rest and after stimulation  Comfort (Breast/Nipple): Soft / non-tender  Hold (Positioning): Assistance needed to correctly position infant at breast and maintain latch.  LATCH Score: 9   Lactation Tools Discussed/Used    Interventions Interventions: Breast feeding basics reviewed;Breast compression;Assisted with latch;Adjust position;Skin to skin;Support pillows;Breast massage;Position options;Hand express;Expressed milk;Education  Discharge Pump: Personal WIC Program: No  Consult Status Consult Status: Follow-up Date: 12/06/20 Follow-up  type: In-patient    Maurina Fawaz  Nicholson-Springer 12/05/2020, 12:18 PM

## 2020-12-05 NOTE — Lactation Note (Signed)
This note was copied from a baby's chart. Lactation Consultation Note Baby 2 hrs old now. RN called stating that mom was ready for LC to come see mom in L&D that baby was rooting and they were finished working on mom. LC w/another pt. Went to room afterwards.  Baby latched well. Mom denied pain. Mom very compressible. Colostrum noted. Asked FOB to come stand beside mom d/t could get sleepy.  Patient Name: Kelsey Ellison [id number] Date: 12/05/2020 Reason for consult: L&D Initial assessment;Primapara;Term Age:38 hours  Maternal Data    Feeding    LATCH Score Latch: Grasps breast easily, tongue down, lips flanged, rhythmical sucking.  Audible Swallowing: None  Type of Nipple: Flat  Comfort (Breast/Nipple): Soft / non-tender  Hold (Positioning): Assistance needed to correctly position infant at breast and maintain latch.  LATCH Score: 6   Lactation Tools Discussed/Used    Interventions Interventions: Support pillows;Position options;Assisted with latch;Skin to skin;Breast massage;Adjust position;Breast compression  Discharge    Consult Status Consult Status: Follow-up Date: 12/05/20 Follow-up type: In-patient    Charyl Dancer 12/05/2020, 4:40 AM

## 2020-12-05 NOTE — Lactation Note (Signed)
This note was copied from a baby's chart. Lactation Consultation Note Attempted to see mom. Dr.'s still in repair of mom's perineum.   Patient Name: Kelsey Ellison Today's Date: 12/05/2020   Age:32 hours  Maternal Data    Feeding    LATCH Score                    Lactation Tools Discussed/Used    Interventions    Discharge    Consult Status      Charyl Dancer 12/05/2020, 3:29 AM

## 2020-12-05 NOTE — Progress Notes (Signed)
S: epidural Intracervical balloon out  O: BP 116/61   Pulse 74   Temp 99.2 F (37.3 C) (Oral)   Resp 18   Ht 5\' 7"  (1.702 m)   Wt (!) 159.7 kg   SpO2 100%   BMI 55.13 kg/m  Pitocin 8 MIU VE 5-6/100/-2 LOA asynclitic AROM scant fluid ISE/IUPC placed  Tracing: baseline 125-130 (+) late decels prior to ISE  Placement. Low BP after epidural placement. 3 doses Of phenylephrine ? Ctx frequency q 2-3 mins  amnioinfusion started  IMP: active phase. Suspect cord compression  (+) GBS on IV PCN Term gestation P) right exaggerated sims  turn off pitocin. Cont amnioinfusion. Close fetal monitoring

## 2020-12-05 NOTE — Anesthesia Postprocedure Evaluation (Signed)
Anesthesia Post Note  Patient: Kelsey Ellison  Procedure(s) Performed: AN AD HOC LABOR EPIDURAL     Patient location during evaluation: Mother Baby Anesthesia Type: Epidural Level of consciousness: awake Pain management: satisfactory to patient Vital Signs Assessment: post-procedure vital signs reviewed and stable Respiratory status: spontaneous breathing Cardiovascular status: stable Anesthetic complications: no   No complications documented.  Last Vitals:  Vitals:   12/05/20 0645 12/05/20 1053  BP: 123/67 131/67  Pulse: (!) 54 (!) 58  Resp: 19 18  Temp: 37.3 C 37.1 C  SpO2: 98% 97%    Last Pain:  Vitals:   12/05/20 1053  TempSrc: Oral  PainSc: 0-No pain   Pain Goal:                   [employer]

## 2020-12-05 NOTE — Progress Notes (Signed)
PPD0 SVD:   S:  Pt reports feeling well/ Tolerating po/ Voiding without problems/ No n/v/ Bleeding is moderate/ Pain controlled withprescription NSAID's including ibuprofen (Motrin)  Newborn info live female BRF   O:  A & O x 3 / VS: Blood pressure (!) 116/56, pulse 64, temperature 98.6 F (37 C), temperature source Oral, resp. rate 18, height 5\' 7"  (1.702 m), weight (!) 159.7 kg, SpO2 96 %, unknown if currently breastfeeding.  LABS:  Results for orders placed or performed during the hospital encounter of 12/04/20 (from the past 24 hour(s))  CBC     Status: Abnormal   Collection Time: 12/04/20  9:02 PM  Result Value Ref Range   WBC 11.3 (H) 4.0 - 10.5 K/uL   RBC 3.68 (L) 3.87 - 5.11 MIL/uL   Hemoglobin 11.6 (L) 12.0 - 15.0 g/dL   HCT 12/06/20 (L) 00.8 - 67.6 %   MCV 90.5 80.0 - 100.0 fL   MCH 31.5 26.0 - 34.0 pg   MCHC 34.8 30.0 - 36.0 g/dL   RDW 19.5 09.3 - 26.7 %   Platelets 299 150 - 400 K/uL   nRBC 0.0 0.0 - 0.2 %  CBC     Status: Abnormal   Collection Time: 12/05/20  5:12 AM  Result Value Ref Range   WBC 16.3 (H) 4.0 - 10.5 K/uL   RBC 3.29 (L) 3.87 - 5.11 MIL/uL   Hemoglobin 10.3 (L) 12.0 - 15.0 g/dL   HCT 12/07/20 (L) 58.0 - 99.8 %   MCV 90.6 80.0 - 100.0 fL   MCH 31.3 26.0 - 34.0 pg   MCHC 34.6 30.0 - 36.0 g/dL   RDW 33.8 25.0 - 53.9 %   Platelets 288 150 - 400 K/uL   nRBC 0.0 0.0 - 0.2 %    I&O: I/O last 3 completed shifts: In: -  Out: 1450 [Urine:1250; Blood:200]   No intake/output data recorded.  Lungs: Heart exam - S1, S2 normal, no murmur, no gallop, rate regular  Heart: regular rate and rhythm, S1, S2 normal, no murmur, click, rub or gallop  Abdomen: soft obese uterus firm at umbilicus  Perineum: nl  Lochia: moderate  Extremities:no redness or tenderness in the calves or thighs, no edema    A/P: PPD # 0/ 76.7 s/p VAVD Chronic HTN  no med   Doing well  Continue routine post partum orders

## 2020-12-06 LAB — CBC
HCT: 30.9 % — ABNORMAL LOW (ref 36.0–46.0)
Hemoglobin: 10 g/dL — ABNORMAL LOW (ref 12.0–15.0)
MCH: 30.2 pg (ref 26.0–34.0)
MCHC: 32.4 g/dL (ref 30.0–36.0)
MCV: 93.4 fL (ref 80.0–100.0)
Platelets: 294 10*3/uL (ref 150–400)
RBC: 3.31 MIL/uL — ABNORMAL LOW (ref 3.87–5.11)
RDW: 13.3 % (ref 11.5–15.5)
WBC: 11 10*3/uL — ABNORMAL HIGH (ref 4.0–10.5)
nRBC: 0 % (ref 0.0–0.2)

## 2020-12-06 MED ORDER — IBUPROFEN 800 MG PO TABS: 800.0000 mg | ORAL_TABLET | Freq: Four times a day (QID) | ORAL | 11 refills | Status: DC | PRN

## 2020-12-06 NOTE — Discharge Summary (Signed)
Postpartum Discharge Summary  Date of Service updated     Patient Name: Kelsey Ellison DOB: 1991/06/02 MRN: [id number]  Date of admission: 12/04/2020 Delivery date:12/05/2020  Delivering provider: Merrit Waugh  Date of discharge: 12/06/2020  Admitting diagnosis: chronic HTN in third trimester, term gestation Intrauterine pregnancy: [redacted]w[redacted]d    Secondary diagnosis:  obesity in pregnanc Additional problems: none    Discharge diagnosis: Term Pregnancy Delivered, CHTN, and Anemia                                              Post partum procedures: none Augmentation: AROM Complications: None  Hospital course: Induction of Labor With Vaginal Delivery   30y.o. yo [id number] admitted to the hospital 12/04/2020 for induction of labor.  Indication for induction:  HTN .  Patient had an uncomplicated labor course as follows: cytotec with IP foley balloon. Followed by pitocin augmentation, AROM Membrane Rupture Time/Date: 12:47 AM ,12/05/2020   amnioinfusion Delivery Method:Vaginal, Vacuum (Extractor)  due to terminal bradycardia Episiotomy: None  Lacerations:  bilateral labia majus, vaginal sulcus, 2nd deg laceration perineal Details of delivery can be found in separate delivery note.  Patient had a routine postpartum course. Patient is discharged home 12/07/20.  Newborn Data: Birth date:12/05/2020  Birth time:2:40 AM  Gender:Female  Living status:Living  Apgars:8 ,9  Weight:3.15 kg   Magnesium Sulfate received: No BMZ received: No Rhophylac:N/A MMR:No T-DaP:Given prenatally Flu: No Transfusion:No  Physical exam  Vitals:   12/05/20 1432 12/05/20 1846 12/05/20 2158 12/06/20 0500  BP: (!) 116/56 125/67 (!) 142/74 (!) 123/56  Pulse: 64 70 61 64  Resp: '18 18 16 17  ' Temp: 98.6 F (37 C) 98.2 F (36.8 C) 99 F (37.2 C) 98.6 F (37 C)  TempSrc: Oral Oral Oral Oral  SpO2: 96% 97% 99% 98%  Weight:      Height:       General: alert, cooperative, and no  distress Lochia: appropriate Uterine Fundus: firm Incision: N/A DVT Evaluation: No evidence of DVT seen on physical exam. Negative Homan's sign. No significant calf/ankle edema. Labs: Lab Results  Component Value Date   WBC 11.0 (H) 12/06/2020   HGB 10.0 (L) 12/06/2020   HCT 30.9 (L) 12/06/2020   MCV 93.4 12/06/2020   PLT 294 12/06/2020   CMP Latest Ref Rng & Units 12/04/2020  Glucose 70 - 99 mg/dL 89  BUN 6 - 20 mg/dL <5(L)  Creatinine 0.44 - 1.00 mg/dL 0.64  Sodium 135 - 145 mmol/L 134(L)  Potassium 3.5 - 5.1 mmol/L 4.0  Chloride 98 - 111 mmol/L 105  CO2 22 - 32 mmol/L 21(L)  Calcium 8.9 - 10.3 mg/dL 8.8(L)  Total Protein 6.5 - 8.1 g/dL 6.8  Total Bilirubin 0.3 - 1.2 mg/dL 0.6  Alkaline Phos 38 - 126 U/L 79  AST 15 - 41 U/L 22  ALT 0 - 44 U/L 17   Edinburgh Score: Edinburgh Postnatal Depression Scale Screening Tool 12/05/2020  I have been able to laugh and see the funny side of things. 0  I have looked forward with enjoyment to things. 0  I have blamed myself unnecessarily when things went wrong. 0  I have been anxious or worried for no good reason. 0  I have felt scared or panicky for no good reason. 0  Things have been getting on  top of me. 0  I have been so unhappy that I have had difficulty sleeping. 0  I have felt sad or miserable. 0  I have been so unhappy that I have been crying. 0  The thought of harming myself has occurred to me. 0  Edinburgh Postnatal Depression Scale Total 0      After visit meds:  Allergies as of 12/06/2020   No Known Allergies      Medication List     TAKE these medications    acetaminophen 500 MG tablet Commonly known as: TYLENOL Take 1,000 mg by mouth every 6 (six) hours as needed (pain).   ferrous sulfate 325 (65 FE) MG EC tablet Take 325 mg by mouth daily.   ibuprofen 800 MG tablet Commonly known as: ADVIL Take 1 tablet (800 mg total) by mouth every 6 (six) hours as needed for moderate pain.   multivitamin with  minerals Tabs tablet Take 1 tablet by mouth every morning.         Discharge home in stable condition Infant Feeding: Breast Infant Disposition:home with mother Discharge instruction: per After Visit Summary and Postpartum booklet. Activity: Advance as tolerated. Pelvic rest for 6 weeks.  Diet: routine diet and low salt diet Anticipated Birth Control: Unsure Postpartum Appointment:6 weeks Additional Postpartum F/U:  none Future Appointments:No future appointments. Follow up Visit:  Follow-up Information     Servando Salina, MD Follow up in 6 week(s).   Specialty: Obstetrics and Gynecology Contact information: [address] [place] [address] [phone]                     12/06/2020 Marvene Staff, MD

## 2020-12-06 NOTE — Progress Notes (Signed)
:   PPD #1 VAVD\  S: doing well. BRF, interested in early discharge  O: Patient Vitals for the past 24 hrs:  BP Temp Temp src Pulse Resp SpO2  12/06/20 0500 (!) 123/56 98.6 F (37 C) Oral 64 17 98 %  12/05/20 2158 (!) 142/74 99 F (37.2 C) Oral 61 16 99 %  12/05/20 1846 125/67 98.2 F (36.8 C) Oral 70 18 97 %  12/05/20 1432 (!) 116/56 98.6 F (37 C) Oral 64 18 96 %  12/05/20 1053 131/67 98.7 F (37.1 C) Oral (!) 58 18 97 %  lungs clear to A  Cor RRR   Breast soft pendulous nontender Abd: soft uterus firm at Umb' Pad scant blood extr no edema or calf tenderness CBC Latest Ref Rng & Units 12/06/2020 12/05/2020 12/04/2020  WBC 4.0 - 10.5 K/uL 11.0(H) 16.3(H) 11.3(H)  Hemoglobin 12.0 - 15.0 g/dL 10.0(L) 10.3(L) 11.6(L)  Hematocrit 36.0 - 46.0 % 30.9(L) 29.8(L) 33.3(L)  Platelets 150 - 400 K/uL 294 288 299  IMP' s/p VAVD doing well Chronic HTN w/o superimposed preeclampsia Postpartum anemia P) d/c home if baby ok for discharge D/c instructions reviewed F/u 6 wk

## 2020-12-06 NOTE — Discharge Instructions (Signed)
Call if temperature greater than equal to 100.4, nothing per vagina for 4-6 weeks or severe nausea vomiting, increased incisional pain , drainage or redness in the incision site, no straining with bowel movements, showers no bath °

## 2022-04-18 DIAGNOSIS — N912 Amenorrhea, unspecified: Secondary | ICD-10-CM | POA: Diagnosis not present

## 2022-04-26 DIAGNOSIS — Z3200 Encounter for pregnancy test, result unknown: Secondary | ICD-10-CM | POA: Diagnosis not present

## 2022-05-06 DIAGNOSIS — Z3481 Encounter for supervision of other normal pregnancy, first trimester: Secondary | ICD-10-CM | POA: Diagnosis not present

## 2022-05-06 LAB — OB RESULTS CONSOLE RPR: RPR: NONREACTIVE

## 2022-05-06 LAB — OB RESULTS CONSOLE HIV ANTIBODY (ROUTINE TESTING): HIV: NONREACTIVE

## 2022-05-06 LAB — OB RESULTS CONSOLE ABO/RH: RH Type: POSITIVE

## 2022-05-06 LAB — OB RESULTS CONSOLE RUBELLA ANTIBODY, IGM: Rubella: IMMUNE

## 2022-05-06 LAB — OB RESULTS CONSOLE ANTIBODY SCREEN: Antibody Screen: NEGATIVE

## 2022-05-06 LAB — OB RESULTS CONSOLE HEPATITIS B SURFACE ANTIGEN: Hepatitis B Surface Ag: NEGATIVE

## 2022-05-06 LAB — HEPATITIS C ANTIBODY: HCV Ab: NEGATIVE

## 2022-05-31 DIAGNOSIS — Z3481 Encounter for supervision of other normal pregnancy, first trimester: Secondary | ICD-10-CM | POA: Diagnosis not present

## 2022-05-31 DIAGNOSIS — B372 Candidiasis of skin and nail: Secondary | ICD-10-CM | POA: Diagnosis not present

## 2022-06-24 LAB — OB RESULTS CONSOLE GC/CHLAMYDIA
Chlamydia: NEGATIVE
Neisseria Gonorrhea: NEGATIVE

## 2022-06-29 DIAGNOSIS — Z361 Encounter for antenatal screening for raised alphafetoprotein level: Secondary | ICD-10-CM | POA: Diagnosis not present

## 2022-06-29 DIAGNOSIS — O99211 Obesity complicating pregnancy, first trimester: Secondary | ICD-10-CM | POA: Diagnosis not present

## 2022-06-29 DIAGNOSIS — Z3481 Encounter for supervision of other normal pregnancy, first trimester: Secondary | ICD-10-CM | POA: Diagnosis not present

## 2022-07-08 ENCOUNTER — Other Ambulatory Visit: Payer: Self-pay | Admitting: Obstetrics and Gynecology

## 2022-07-08 DIAGNOSIS — Z363 Encounter for antenatal screening for malformations: Secondary | ICD-10-CM

## 2022-07-20 ENCOUNTER — Encounter: Payer: Self-pay | Admitting: *Deleted

## 2022-07-25 ENCOUNTER — Ambulatory Visit: Payer: BC Managed Care – PPO | Admitting: *Deleted

## 2022-07-25 ENCOUNTER — Other Ambulatory Visit: Payer: Self-pay | Admitting: *Deleted

## 2022-07-25 ENCOUNTER — Encounter: Payer: Self-pay | Admitting: *Deleted

## 2022-07-25 ENCOUNTER — Telehealth: Payer: Self-pay

## 2022-07-25 ENCOUNTER — Ambulatory Visit: Payer: BC Managed Care – PPO | Attending: Obstetrics and Gynecology

## 2022-07-25 VITALS — BP 124/76 | HR 77

## 2022-07-25 DIAGNOSIS — Z363 Encounter for antenatal screening for malformations: Secondary | ICD-10-CM | POA: Insufficient documentation

## 2022-07-25 DIAGNOSIS — Z362 Encounter for other antenatal screening follow-up: Secondary | ICD-10-CM

## 2022-07-25 DIAGNOSIS — O99212 Obesity complicating pregnancy, second trimester: Secondary | ICD-10-CM

## 2022-07-25 DIAGNOSIS — Z3689 Encounter for other specified antenatal screening: Secondary | ICD-10-CM | POA: Diagnosis not present

## 2022-07-25 NOTE — Telephone Encounter (Signed)
Spoke with patient - let her know that her [employer] appointment was being moved onto [employer] schedule at [facility] - asked patient is she would rather have a 10am or 11am appointment.  Patient moved to the [place] appointment at [facility]

## 2022-08-02 ENCOUNTER — Ambulatory Visit: Payer: BC Managed Care – PPO

## 2022-08-02 DIAGNOSIS — Z3482 Encounter for supervision of other normal pregnancy, second trimester: Secondary | ICD-10-CM | POA: Diagnosis not present

## 2022-08-23 ENCOUNTER — Encounter: Payer: Self-pay | Admitting: *Deleted

## 2022-08-23 ENCOUNTER — Ambulatory Visit: Payer: BC Managed Care – PPO | Admitting: *Deleted

## 2022-08-23 ENCOUNTER — Ambulatory Visit: Payer: BC Managed Care – PPO | Attending: Obstetrics and Gynecology

## 2022-08-23 DIAGNOSIS — O10012 Pre-existing essential hypertension complicating pregnancy, second trimester: Secondary | ICD-10-CM | POA: Diagnosis not present

## 2022-08-23 DIAGNOSIS — Z362 Encounter for other antenatal screening follow-up: Secondary | ICD-10-CM | POA: Insufficient documentation

## 2022-08-23 DIAGNOSIS — O99212 Obesity complicating pregnancy, second trimester: Secondary | ICD-10-CM | POA: Insufficient documentation

## 2022-08-23 DIAGNOSIS — Z3A23 23 weeks gestation of pregnancy: Secondary | ICD-10-CM

## 2022-08-24 ENCOUNTER — Other Ambulatory Visit: Payer: Self-pay | Admitting: *Deleted

## 2022-08-24 DIAGNOSIS — O99212 Obesity complicating pregnancy, second trimester: Secondary | ICD-10-CM

## 2022-08-24 DIAGNOSIS — O280 Abnormal hematological finding on antenatal screening of mother: Secondary | ICD-10-CM

## 2022-08-24 DIAGNOSIS — O10912 Unspecified pre-existing hypertension complicating pregnancy, second trimester: Secondary | ICD-10-CM

## 2022-08-24 DIAGNOSIS — Z6841 Body Mass Index (BMI) 40.0 and over, adult: Secondary | ICD-10-CM

## 2022-08-30 DIAGNOSIS — Z3689 Encounter for other specified antenatal screening: Secondary | ICD-10-CM | POA: Diagnosis not present

## 2022-08-30 DIAGNOSIS — O99212 Obesity complicating pregnancy, second trimester: Secondary | ICD-10-CM | POA: Diagnosis not present

## 2022-08-30 DIAGNOSIS — Z3482 Encounter for supervision of other normal pregnancy, second trimester: Secondary | ICD-10-CM | POA: Diagnosis not present

## 2022-09-19 NOTE — L&D Delivery Note (Signed)
Delivery Note At 10:19 PM a viable and healthy female was delivered via Vaginal, Spontaneous (Presentation: Right Occiput Anterior).  APGAR: 9, 9; weight pending .   Placenta status: Spontaneous,intact not sent  .  Cord: 3 vessels with the following complications: None.  Cord pH: n/a  Anesthesia: Epidural Episiotomy: None Lacerations: 2nd degree;Perineal Suture Repair: 3.0 chromic Est. Blood Loss (mL): 155  Mom to postpartum.  Baby to Couplet care / Skin to Skin.  Marki Frede A Taliah Porche 12/11/2022, 11:45 PM

## 2022-09-26 ENCOUNTER — Ambulatory Visit: Payer: BC Managed Care – PPO

## 2022-09-29 ENCOUNTER — Ambulatory Visit (HOSPITAL_BASED_OUTPATIENT_CLINIC_OR_DEPARTMENT_OTHER): Payer: BC Managed Care – PPO

## 2022-09-29 ENCOUNTER — Ambulatory Visit: Payer: BC Managed Care – PPO | Attending: Obstetrics and Gynecology | Admitting: *Deleted

## 2022-09-29 VITALS — BP 106/75 | HR 100

## 2022-09-29 DIAGNOSIS — Z3A28 28 weeks gestation of pregnancy: Secondary | ICD-10-CM | POA: Insufficient documentation

## 2022-09-29 DIAGNOSIS — O99212 Obesity complicating pregnancy, second trimester: Secondary | ICD-10-CM

## 2022-09-29 DIAGNOSIS — Z3483 Encounter for supervision of other normal pregnancy, third trimester: Secondary | ICD-10-CM | POA: Diagnosis not present

## 2022-09-29 DIAGNOSIS — O99213 Obesity complicating pregnancy, third trimester: Secondary | ICD-10-CM

## 2022-09-29 DIAGNOSIS — O280 Abnormal hematological finding on antenatal screening of mother: Secondary | ICD-10-CM

## 2022-09-29 DIAGNOSIS — Z6841 Body Mass Index (BMI) 40.0 and over, adult: Secondary | ICD-10-CM

## 2022-09-29 DIAGNOSIS — O10913 Unspecified pre-existing hypertension complicating pregnancy, third trimester: Secondary | ICD-10-CM | POA: Insufficient documentation

## 2022-09-29 DIAGNOSIS — O10912 Unspecified pre-existing hypertension complicating pregnancy, second trimester: Secondary | ICD-10-CM

## 2022-09-29 DIAGNOSIS — O10013 Pre-existing essential hypertension complicating pregnancy, third trimester: Secondary | ICD-10-CM | POA: Diagnosis not present

## 2022-09-29 DIAGNOSIS — O99810 Abnormal glucose complicating pregnancy: Secondary | ICD-10-CM | POA: Diagnosis not present

## 2022-09-30 ENCOUNTER — Other Ambulatory Visit: Payer: Self-pay | Admitting: *Deleted

## 2022-09-30 DIAGNOSIS — O99213 Obesity complicating pregnancy, third trimester: Secondary | ICD-10-CM

## 2022-09-30 DIAGNOSIS — R638 Other symptoms and signs concerning food and fluid intake: Secondary | ICD-10-CM

## 2022-10-12 DIAGNOSIS — O10013 Pre-existing essential hypertension complicating pregnancy, third trimester: Secondary | ICD-10-CM | POA: Diagnosis not present

## 2022-10-26 DIAGNOSIS — O10013 Pre-existing essential hypertension complicating pregnancy, third trimester: Secondary | ICD-10-CM | POA: Diagnosis not present

## 2022-10-27 ENCOUNTER — Ambulatory Visit: Payer: BC Managed Care – PPO

## 2022-10-28 DIAGNOSIS — O99213 Obesity complicating pregnancy, third trimester: Secondary | ICD-10-CM | POA: Diagnosis not present

## 2022-10-28 DIAGNOSIS — O10013 Pre-existing essential hypertension complicating pregnancy, third trimester: Secondary | ICD-10-CM | POA: Diagnosis not present

## 2022-10-28 DIAGNOSIS — Z3483 Encounter for supervision of other normal pregnancy, third trimester: Secondary | ICD-10-CM | POA: Diagnosis not present

## 2022-11-08 DIAGNOSIS — O10013 Pre-existing essential hypertension complicating pregnancy, third trimester: Secondary | ICD-10-CM | POA: Diagnosis not present

## 2022-11-08 DIAGNOSIS — Z23 Encounter for immunization: Secondary | ICD-10-CM | POA: Diagnosis not present

## 2022-11-22 DIAGNOSIS — O10013 Pre-existing essential hypertension complicating pregnancy, third trimester: Secondary | ICD-10-CM | POA: Diagnosis not present

## 2022-11-22 DIAGNOSIS — O99213 Obesity complicating pregnancy, third trimester: Secondary | ICD-10-CM | POA: Diagnosis not present

## 2022-11-22 DIAGNOSIS — Z3483 Encounter for supervision of other normal pregnancy, third trimester: Secondary | ICD-10-CM | POA: Diagnosis not present

## 2022-11-22 DIAGNOSIS — Z3685 Encounter for antenatal screening for Streptococcus B: Secondary | ICD-10-CM | POA: Diagnosis not present

## 2022-11-23 LAB — OB RESULTS CONSOLE GBS: GBS: NEGATIVE

## 2022-11-29 DIAGNOSIS — Z3483 Encounter for supervision of other normal pregnancy, third trimester: Secondary | ICD-10-CM | POA: Diagnosis not present

## 2022-11-29 DIAGNOSIS — O99213 Obesity complicating pregnancy, third trimester: Secondary | ICD-10-CM | POA: Diagnosis not present

## 2022-12-05 ENCOUNTER — Encounter (HOSPITAL_COMMUNITY): Payer: Self-pay | Admitting: *Deleted

## 2022-12-05 ENCOUNTER — Telehealth (HOSPITAL_COMMUNITY): Payer: Self-pay | Admitting: *Deleted

## 2022-12-05 NOTE — Telephone Encounter (Signed)
Preadmission screen  

## 2022-12-07 DIAGNOSIS — Z3483 Encounter for supervision of other normal pregnancy, third trimester: Secondary | ICD-10-CM | POA: Diagnosis not present

## 2022-12-07 DIAGNOSIS — O10013 Pre-existing essential hypertension complicating pregnancy, third trimester: Secondary | ICD-10-CM | POA: Diagnosis not present

## 2022-12-07 DIAGNOSIS — O99213 Obesity complicating pregnancy, third trimester: Secondary | ICD-10-CM | POA: Diagnosis not present

## 2022-12-10 ENCOUNTER — Inpatient Hospital Stay (HOSPITAL_COMMUNITY): Payer: BC Managed Care – PPO

## 2022-12-10 ENCOUNTER — Encounter (HOSPITAL_COMMUNITY): Payer: BC Managed Care – PPO

## 2022-12-11 ENCOUNTER — Inpatient Hospital Stay (HOSPITAL_COMMUNITY): Payer: BC Managed Care – PPO | Admitting: Anesthesiology

## 2022-12-11 ENCOUNTER — Inpatient Hospital Stay (HOSPITAL_COMMUNITY): Payer: BC Managed Care – PPO

## 2022-12-11 ENCOUNTER — Other Ambulatory Visit: Payer: Self-pay

## 2022-12-11 ENCOUNTER — Inpatient Hospital Stay (HOSPITAL_COMMUNITY)
Admission: RE | Admit: 2022-12-11 | Discharge: 2022-12-13 | DRG: 807 | Disposition: A | Discharge status: Home / Self Care | Payer: BC Managed Care – PPO | Attending: Obstetrics and Gynecology | Admitting: Obstetrics and Gynecology

## 2022-12-11 DIAGNOSIS — O1002 Pre-existing essential hypertension complicating childbirth: Secondary | ICD-10-CM | POA: Diagnosis present

## 2022-12-11 DIAGNOSIS — O99214 Obesity complicating childbirth: Secondary | ICD-10-CM | POA: Diagnosis not present

## 2022-12-11 DIAGNOSIS — Z3A39 39 weeks gestation of pregnancy: Secondary | ICD-10-CM | POA: Diagnosis not present

## 2022-12-11 LAB — TYPE AND SCREEN
ABO/RH(D): O POS
Antibody Screen: NEGATIVE

## 2022-12-11 LAB — CBC
HCT: 35.4 % — ABNORMAL LOW (ref 36.0–46.0)
Hemoglobin: 11.3 g/dL — ABNORMAL LOW (ref 12.0–15.0)
MCH: 30 pg (ref 26.0–34.0)
MCHC: 31.9 g/dL (ref 30.0–36.0)
MCV: 93.9 fL (ref 80.0–100.0)
Platelets: 288 10*3/uL (ref 150–400)
RBC: 3.77 MIL/uL — ABNORMAL LOW (ref 3.87–5.11)
RDW: 13.5 % (ref 11.5–15.5)
WBC: 10.7 10*3/uL — ABNORMAL HIGH (ref 4.0–10.5)
nRBC: 0 % (ref 0.0–0.2)

## 2022-12-11 MED ORDER — LACTATED RINGERS IV SOLN: INTRAVENOUS | Status: DC

## 2022-12-11 MED ORDER — OXYTOCIN BOLUS FROM INFUSION
333.0000 mL | Freq: Once | INTRAVENOUS | Status: AC
  Administered 2022-12-11: 333 mL via INTRAVENOUS

## 2022-12-11 MED ORDER — OXYCODONE-ACETAMINOPHEN 5-325 MG PO TABS: 1.0000 | ORAL_TABLET | ORAL | Status: DC | PRN

## 2022-12-11 MED ORDER — CLINDAMYCIN PHOSPHATE 900 MG/50ML IV SOLN
900.0000 mg | Freq: Three times a day (TID) | INTRAVENOUS | Status: DC
  Filled 2022-12-11: qty 50

## 2022-12-11 MED ORDER — LACTATED RINGERS IV SOLN: 500.0000 mL | INTRAVENOUS | Status: DC | PRN

## 2022-12-11 MED ORDER — SOD CITRATE-CITRIC ACID 500-334 MG/5ML PO SOLN: 30.0000 mL | ORAL | Status: DC | PRN

## 2022-12-11 MED ORDER — FENTANYL CITRATE (PF) 100 MCG/2ML IJ SOLN: 100.0000 ug | INTRAMUSCULAR | Status: DC | PRN

## 2022-12-11 MED ORDER — DIPHENHYDRAMINE HCL 50 MG/ML IJ SOLN: 12.5000 mg | INTRAMUSCULAR | Status: DC | PRN

## 2022-12-11 MED ORDER — PHENYLEPHRINE 80 MCG/ML (10ML) SYRINGE FOR IV PUSH (FOR BLOOD PRESSURE SUPPORT)
80.0000 ug | PREFILLED_SYRINGE | INTRAVENOUS | Status: DC | PRN
  Filled 2022-12-11: qty 10

## 2022-12-11 MED ORDER — OXYTOCIN 10 UNIT/ML IJ SOLN: 10.0000 [IU] | Freq: Once | INTRAMUSCULAR | Status: DC

## 2022-12-11 MED ORDER — LIDOCAINE HCL (PF) 1 % IJ SOLN: 30.0000 mL | INTRAMUSCULAR | Status: DC | PRN

## 2022-12-11 MED ORDER — ONDANSETRON HCL 4 MG/2ML IJ SOLN: 4.0000 mg | Freq: Four times a day (QID) | INTRAMUSCULAR | Status: DC | PRN

## 2022-12-11 MED ORDER — EPHEDRINE 5 MG/ML INJ: 10.0000 mg | INTRAVENOUS | Status: DC | PRN

## 2022-12-11 MED ORDER — LIDOCAINE HCL (PF) 1 % IJ SOLN
INTRAMUSCULAR | Status: DC | PRN
  Administered 2022-12-11: 5 mL via EPIDURAL

## 2022-12-11 MED ORDER — LACTATED RINGERS IV SOLN
500.0000 mL | Freq: Once | INTRAVENOUS | Status: AC
  Administered 2022-12-11: 500 mL via INTRAVENOUS

## 2022-12-11 MED ORDER — FENTANYL-BUPIVACAINE-NACL 0.5-0.125-0.9 MG/250ML-% EP SOLN
EPIDURAL | Status: DC | PRN
  Administered 2022-12-11: 12 mL/h via EPIDURAL

## 2022-12-11 MED ORDER — TERBUTALINE SULFATE 1 MG/ML IJ SOLN: 0.2500 mg | Freq: Once | INTRAMUSCULAR | Status: DC | PRN

## 2022-12-11 MED ORDER — OXYCODONE-ACETAMINOPHEN 5-325 MG PO TABS: 2.0000 | ORAL_TABLET | ORAL | Status: DC | PRN

## 2022-12-11 MED ORDER — ACETAMINOPHEN 325 MG PO TABS: 650.0000 mg | ORAL_TABLET | ORAL | Status: DC | PRN

## 2022-12-11 MED ORDER — OXYTOCIN-SODIUM CHLORIDE 30-0.9 UT/500ML-% IV SOLN
1.0000 m[IU]/min | INTRAVENOUS | Status: DC
  Administered 2022-12-11: 2 m[IU]/min via INTRAVENOUS
  Filled 2022-12-11: qty 500

## 2022-12-11 MED ORDER — FENTANYL-BUPIVACAINE-NACL 0.5-0.125-0.9 MG/250ML-% EP SOLN
12.0000 mL/h | EPIDURAL | Status: DC | PRN
  Filled 2022-12-11: qty 250

## 2022-12-11 MED ORDER — PHENYLEPHRINE 80 MCG/ML (10ML) SYRINGE FOR IV PUSH (FOR BLOOD PRESSURE SUPPORT): 80.0000 ug | PREFILLED_SYRINGE | INTRAVENOUS | Status: DC | PRN

## 2022-12-11 MED ORDER — OXYTOCIN-SODIUM CHLORIDE 30-0.9 UT/500ML-% IV SOLN
2.5000 [IU]/h | INTRAVENOUS | Status: DC
  Administered 2022-12-11: 2.5 [IU]/h via INTRAVENOUS

## 2022-12-11 NOTE — Anesthesia Preprocedure Evaluation (Signed)
Anesthesia Evaluation  Patient identified by MRN, date of birth, ID band Patient awake    Reviewed: Allergy & Precautions, NPO status , Patient's Chart, lab work & pertinent test results  Airway Mallampati: III  TM Distance: >3 FB Neck ROM: Full    Dental no notable dental hx.    Pulmonary neg pulmonary ROS   Pulmonary exam normal breath sounds clear to auscultation       Cardiovascular negative cardio ROS Normal cardiovascular exam Rhythm:Regular Rate:Normal     Neuro/Psych negative neurological ROS  negative psych ROS   GI/Hepatic negative GI ROS, Neg liver ROS,,,  Endo/Other  negative endocrine ROS  Morbid obesity (BMI 56.23)  Renal/GU negative Renal ROS     Musculoskeletal   Abdominal   Peds  Hematology Lab Results      Component                Value               Date                      WBC                      10.7 (H)            12/11/2022                HGB                      11.3 (L)            12/11/2022                HCT                      35.4 (L)            12/11/2022                PLT                      288                 12/11/2022              Anesthesia Other Findings   Reproductive/Obstetrics (+) Pregnancy                             Anesthesia Physical Anesthesia Plan  ASA: 3  Anesthesia Plan: Epidural   Post-op Pain Management:    Induction:   PONV Risk Score and Plan:   Airway Management Planned:   Additional Equipment:   Intra-op Plan:   Post-operative Plan:   Informed Consent: I have reviewed the patients History and Physical, chart, labs and discussed the procedure including the risks, benefits and alternatives for the proposed anesthesia with the patient or authorized representative who has indicated his/her understanding and acceptance.       Plan Discussed with:   Anesthesia Plan Comments: (39.1 Wk G5P1 w BMI 56.2 for LEA)         Anesthesia Quick Evaluation

## 2022-12-11 NOTE — Progress Notes (Signed)
Mainline attached to saline lock.  Pump alarmed "occluded"  attempted to manually flush IV resistance felt.  Had second RN attempt flush with same result.  Another order for IV team placed on pt

## 2022-12-11 NOTE — H&P (Signed)
Kelsey Ellison is a 32 y.o. female presenting for IOL 2nd to term, obesity borderline chronic HTN. OB History     Gravida  5   Para  1   Term  1   Preterm  0   AB  3   Living  1      SAB  0   IAB  0   Ectopic  0   Multiple  0   Live Births  1          Past Medical History:  Diagnosis Date   Anemia    Morbid obesity ([place])    Obesity affecting pregnancy    Past Surgical History:  Procedure Laterality Date   NO PAST SURGERIES     Family History: family history is not on file. Social History:  reports that she has never smoked. She has never used smokeless tobacco. She reports that she does not currently use alcohol. She reports that she does not currently use drugs.     Maternal Diabetes: No Genetic Screening: Normal Maternal Ultrasounds/Referrals: Normal Fetal Ultrasounds or other Referrals:  None Maternal Substance Abuse:  No Significant Maternal Medications:  Meds include: Other: baby asa Significant Maternal Lab Results:  Group B Strep negative Number of Prenatal Visits:greater than 3 verified prenatal visits Other Comments:   morbid obesity  Review of Systems  All other systems reviewed and are negative.  History   Blood pressure 104/72, pulse 72, temperature 98.4 F (36.9 C), temperature source Oral, resp. rate 20, height 5\' 7"  (1.702 m), weight (!) 162.8 kg, last menstrual period 03/12/2022, unknown if currently breastfeeding. Maternal Exam:  Introitus: Normal vulva.   Physical Exam Constitutional:      Appearance: Normal appearance.  HENT:     Head: Atraumatic.  Eyes:     Extraocular Movements: Extraocular movements intact.  Cardiovascular:     Rate and Rhythm: Regular rhythm.     Heart sounds: Normal heart sounds.  Pulmonary:     Breath sounds: Normal breath sounds.  Abdominal:     Comments: gravid  Genitourinary:    General: Normal vulva.  Musculoskeletal:     Cervical back: Neck supple.  Skin:    General: Skin is warm  and dry.  Neurological:     General: No focal deficit present.     Mental Status: She is alert and oriented to person, place, and time.  Psychiatric:        Mood and Affect: Mood normal.        Behavior: Behavior normal.     Prenatal labs: ABO, Rh: O/Positive/-- (08/18 0000) Antibody: Negative (08/18 0000) Rubella: Immune (08/18 0000) RPR: Nonreactive (08/18 0000)  HBsAg: Negative (08/18 0000)  HIV: Non-reactive (08/18 0000)  GBS: Negative/-- (03/06 0000)   Assessment/Plan: Term Morbid obesity P) admit routine labs. IV Pitocin . Analgesic., intracervical balloon prn   Kelsey Ellison 12/11/2022, 8:35 AM

## 2022-12-11 NOTE — Progress Notes (Addendum)
Kelsey Ellison is a 32 y.o. G5P1031 at [redacted]w[redacted]d by LMP admitted for induction of labor due to Post dates. Due date 3/18.  Subjective: No chief complaint on file.   Objective: BP (!) 148/92   Pulse 82   Temp 98.3 F (36.8 C)   Resp 16   Ht 5\' 7"  (1.702 m)   Wt (!) 162.8 kg   LMP 03/12/2022   BMI 56.23 kg/m  No intake/output data recorded. No intake/output data recorded.  FHT:  FHR: 120 bpm, variability: moderate,  accelerations:  Present,  decelerations:  Absent UC:   irregular SVE:   1.5 cm dilated, 70% effaced, -2 station Tracing: cat 1 Intracervical balloon placed  Labs: Lab Results  Component Value Date   WBC 10.7 (H) 12/11/2022   HGB 11.3 (L) 12/11/2022   HCT 35.4 (L) 12/11/2022   MCV 93.9 12/11/2022   PLT 288 12/11/2022    Assessment / Plan: Latent phase Term gestation P) continue pitocin. Analgesic prn   Anticipated MOD:  NSVD  Kelsey Ellison 12/11/2022, 2:07 PM

## 2022-12-11 NOTE — Anesthesia Procedure Notes (Signed)
Epidural Patient location during procedure: OB Start time: 12/11/2022 2:44 PM End time: 12/11/2022 2:59 PM  Staffing Anesthesiologist: Barnet Glasgow, MD Performed: anesthesiologist   Preanesthetic Checklist Completed: patient identified, IV checked, site marked, risks and benefits discussed, surgical consent, monitors and equipment checked, pre-op evaluation and timeout performed  Epidural Patient position: sitting Prep: DuraPrep and site prepped and draped Patient monitoring: continuous pulse ox and blood pressure Approach: midline Location: L3-L4 Injection technique: LOR air  Needle:  Needle type: Tuohy  Needle gauge: 17 G Needle length: 9 cm and 9 Needle insertion depth: 9 cm Catheter type: closed end flexible Catheter size: 19 Gauge Catheter at skin depth: 15 cm Test dose: negative  Assessment Events: blood not aspirated, no cerebrospinal fluid, injection not painful, no injection resistance, no paresthesia and negative IV test  Additional Notes Patient identified. Risks/Benefits/Options discussed with patient including but not limited to bleeding, infection, nerve damage, paralysis, failed block, incomplete pain control, headache, blood pressure changes, nausea, vomiting, reactions to medication both or allergic, itching and postpartum back pain. Confirmed with bedside nurse the patient's most recent platelet count. Confirmed with patient that they are not currently taking any anticoagulation, have any bleeding history or any family history of bleeding disorders. Patient expressed understanding and wished to proceed. All questions were answered. Sterile technique was used throughout the entire procedure. Please see nursing notes for vital signs. Test dose was given through epidural needle and negative prior to continuing to dose epidural or start infusion. Warning signs of high block given to the patient including shortness of breath, tingling/numbness in hands, complete  motor block, or any concerning symptoms with instructions to call for help. Patient was given instructions on fall risk and not to get out of bed. All questions and concerns addressed with instructions to call with any issues.  1 Attempt (S) . Patient tolerated procedure well.

## 2022-12-11 NOTE — Progress Notes (Signed)
S: Epidural   O: Pitocin VS BP  141/80 P79 RR18 VE 4/80/-2 AROM clear fluid IUPC placed   Tracing: baseline 140 (+) accel  15 x 15 Ctx q 2 mins  IMP: latent/active phase Term gestation P) left exaggerated sims. Continue pitocin

## 2022-12-12 ENCOUNTER — Encounter (HOSPITAL_COMMUNITY): Payer: Self-pay | Admitting: Obstetrics and Gynecology

## 2022-12-12 ENCOUNTER — Inpatient Hospital Stay (HOSPITAL_COMMUNITY): Payer: BC Managed Care – PPO

## 2022-12-12 LAB — CBC
HCT: 33.2 % — ABNORMAL LOW (ref 36.0–46.0)
Hemoglobin: 10.9 g/dL — ABNORMAL LOW (ref 12.0–15.0)
MCH: 30.8 pg (ref 26.0–34.0)
MCHC: 32.8 g/dL (ref 30.0–36.0)
MCV: 93.8 fL (ref 80.0–100.0)
Platelets: 270 10*3/uL (ref 150–400)
RBC: 3.54 MIL/uL — ABNORMAL LOW (ref 3.87–5.11)
RDW: 13.3 % (ref 11.5–15.5)
WBC: 15 10*3/uL — ABNORMAL HIGH (ref 4.0–10.5)
nRBC: 0 % (ref 0.0–0.2)

## 2022-12-12 LAB — RPR: RPR Ser Ql: NONREACTIVE

## 2022-12-12 MED ORDER — COCONUT OIL OIL: 1.0000 | TOPICAL_OIL | Status: DC | PRN

## 2022-12-12 MED ORDER — WITCH HAZEL-GLYCERIN EX PADS: 1.0000 | MEDICATED_PAD | CUTANEOUS | Status: DC | PRN

## 2022-12-12 MED ORDER — FERROUS SULFATE 325 (65 FE) MG PO TABS
325.0000 mg | ORAL_TABLET | Freq: Two times a day (BID) | ORAL | Status: DC
  Administered 2022-12-12 (×2): 325 mg via ORAL
  Filled 2022-12-12 (×2): qty 1

## 2022-12-12 MED ORDER — BENZOCAINE-MENTHOL 20-0.5 % EX AERO: 1.0000 | INHALATION_SPRAY | CUTANEOUS | Status: DC | PRN

## 2022-12-12 MED ORDER — ONDANSETRON HCL 4 MG PO TABS: 4.0000 mg | ORAL_TABLET | ORAL | Status: DC | PRN

## 2022-12-12 MED ORDER — PRENATAL MULTIVITAMIN CH
1.0000 | ORAL_TABLET | Freq: Every day | ORAL | Status: DC
  Administered 2022-12-12: 1 via ORAL
  Filled 2022-12-12 (×2): qty 1

## 2022-12-12 MED ORDER — SIMETHICONE 80 MG PO CHEW: 80.0000 mg | CHEWABLE_TABLET | ORAL | Status: DC | PRN

## 2022-12-12 MED ORDER — IBUPROFEN 600 MG PO TABS
600.0000 mg | ORAL_TABLET | Freq: Four times a day (QID) | ORAL | Status: DC
  Administered 2022-12-12 – 2022-12-13 (×6): 600 mg via ORAL
  Filled 2022-12-12 (×5): qty 1

## 2022-12-12 MED ORDER — DIPHENHYDRAMINE HCL 25 MG PO CAPS: 25.0000 mg | ORAL_CAPSULE | Freq: Four times a day (QID) | ORAL | Status: DC | PRN

## 2022-12-12 MED ORDER — SENNOSIDES-DOCUSATE SODIUM 8.6-50 MG PO TABS
2.0000 | ORAL_TABLET | Freq: Every day | ORAL | Status: DC
  Administered 2022-12-12 – 2022-12-13 (×2): 2 via ORAL
  Filled 2022-12-12 (×2): qty 2

## 2022-12-12 MED ORDER — DIBUCAINE (PERIANAL) 1 % EX OINT: 1.0000 | TOPICAL_OINTMENT | CUTANEOUS | Status: DC | PRN

## 2022-12-12 MED ORDER — ZOLPIDEM TARTRATE 5 MG PO TABS: 5.0000 mg | ORAL_TABLET | Freq: Every evening | ORAL | Status: DC | PRN

## 2022-12-12 MED ORDER — ACETAMINOPHEN 325 MG PO TABS
650.0000 mg | ORAL_TABLET | ORAL | Status: DC | PRN
  Administered 2022-12-12 (×3): 650 mg via ORAL
  Filled 2022-12-12 (×3): qty 2

## 2022-12-12 MED ORDER — ONDANSETRON HCL 4 MG/2ML IJ SOLN: 4.0000 mg | INTRAMUSCULAR | Status: DC | PRN

## 2022-12-12 NOTE — Progress Notes (Signed)
PPD{NUMBERS 1-5:20334} SVD:   S:  Pt reports feeling ***/ Tolerating po/ Voiding without problems/ No n/v/ Bleeding is {Description; bleeding vaginal:11356}/ Pain controlled with{treatments; pain control med:13496}  Newborn info ***   O:  A & O x 3 ***/ VS: Blood pressure 128/68, pulse 60, temperature 98.3 F (36.8 C), temperature source Oral, resp. rate 18, height 5\' 7"  (1.702 m), weight (!) 162.8 kg, last menstrual period 03/12/2022, SpO2 99 %, unknown if currently breastfeeding.  LABS:  Results for orders placed or performed during the hospital encounter of 12/11/22 (from the past 24 hour(s))  CBC     Status: Abnormal   Collection Time: 12/12/22  5:54 AM  Result Value Ref Range   WBC 15.0 (H) 4.0 - 10.5 K/uL   RBC 3.54 (L) 3.87 - 5.11 MIL/uL   Hemoglobin 10.9 (L) 12.0 - 15.0 g/dL   HCT 33.2 (L) 36.0 - 46.0 %   MCV 93.8 80.0 - 100.0 fL   MCH 30.8 26.0 - 34.0 pg   MCHC 32.8 30.0 - 36.0 g/dL   RDW 13.3 11.5 - 15.5 %   Platelets 270 150 - 400 K/uL   nRBC 0.0 0.0 - 0.2 %    I&O: I/O last 3 completed shifts: In: -  Out: 955 [Urine:800; Blood:155]   No intake/output data recorded.  Lungs: {Exam; lungs:5033}  Heart: {Exam; heart:5510}  Abdomen: {PE ABDOMEN POSTPARTUM OBGYN:313106}  Perineum: {exam; perineum:10172}  Lochia: ***  Extremities:{pe extremities VL:3640416    A/P: PPD # ***/ [id number]:[id number]  Doing well  Continue routine post partum orders  ***

## 2022-12-12 NOTE — Lactation Note (Signed)
This note was copied from a baby's chart. Lactation Consultation Note  Patient Name: Kelsey Ellison [id number] Date: 12/12/2022 Age:34 hours  Per RN Luana Shu), Birth Parent declined [facility] services on [place].   Maternal Data    Feeding    LATCH Score                    Lactation Tools Discussed/Used    Interventions    Discharge    Consult Status      Eulis Canner 12/12/2022, 7:08 PM

## 2022-12-12 NOTE — Anesthesia Postprocedure Evaluation (Signed)
Anesthesia Post Note  Patient: Kelsey Ellison  Procedure(s) Performed: AN AD HOC LABOR EPIDURAL     Patient location during evaluation: Mother Baby Anesthesia Type: Epidural Level of consciousness: awake and alert Pain management: pain level controlled Vital Signs Assessment: post-procedure vital signs reviewed and stable Respiratory status: spontaneous breathing, nonlabored ventilation and respiratory function stable Cardiovascular status: stable Postop Assessment: no headache, no backache and epidural receding Anesthetic complications: no  No notable events documented.  Last Vitals:  Vitals:   12/12/22 0130 12/12/22 0516  BP: (!) 139/59 138/87  Pulse: 78 82  Resp: 16 16  Temp: 36.8 C 36.8 C  SpO2:      Last Pain:  Vitals:   12/12/22 0516  TempSrc: Oral  PainSc: 2    Pain Goal:                   Sandrea Matte

## 2022-12-13 MED ORDER — HYDROCHLOROTHIAZIDE 25 MG PO TABS: 25.0000 mg | ORAL_TABLET | Freq: Every day | ORAL | 0 refills | Status: DC

## 2022-12-13 NOTE — Progress Notes (Signed)
PPD2 SVD:   S:  Pt reports feeling well/ Tolerating po/ Voiding without problems/ No n/v/ Bleeding is light/ Pain controlled withprescription NSAID's including ibuprofen (Motrin)  Newborn info live female  circ BRF   O:  A & O x 3 / VS: Blood pressure (!) 120/58, pulse (!) 58, temperature 98 F (36.7 C), temperature source Oral, resp. rate 16, height 5\' 7"  (1.702 m), weight (!) 162.8 kg, last menstrual period 03/12/2022, SpO2 99 %, unknown if currently breastfeeding.  LABS: No results found for this or any previous visit (from the past 24 hour(s)).  I&O: I/O last 3 completed shifts: In: -  Out: 955 [Urine:800; Blood:155]   No intake/output data recorded.  Lungs: chest clear, no wheezing, rales, normal symmetric air entry  Heart: regular rate and rhythm, S1, S2 normal, no murmur, click, rub or gallop  Abdomen: uterus firm non tender  Perineum: not inspected  Lochia: light  Extremities:no redness or tenderness in the calves or thighs, edema tr-+1+    A/P: PPD # 2/ [id number]   Doing well  Continue routine post partum orders  D/c home  Hctz 25 mg po qd x 5 days D/c instruction reviewed F/u 6 wk

## 2022-12-13 NOTE — Discharge Summary (Signed)
Postpartum Discharge Summary  Date of Service updated     Patient Name: Kelsey Ellison DOB: 06-10-1991 MRN: [id number]  Date of admission: 12/11/2022 Delivery date:12/11/2022  Delivering provider: Laythan Hayter  Date of discharge: 12/13/2022  Admitting diagnosis: Indication for care in labor or delivery [O75.9] Postpartum care following vaginal delivery [Z39.2] Intrauterine pregnancy: [redacted]w[redacted]d     Secondary diagnosis:  Principal Problem:   Indication for care in labor or delivery Active Problems:   Postpartum care following vaginal delivery  Additional problems: obesity affecting pregnancy,     Discharge diagnosis: Term Pregnancy Delivered and obesity affecting pregnancy, chronic HTn                                               Post partum procedures: none Augmentation: AROM Complications: None  Hospital course: Induction of Labor With Vaginal Delivery   32 y.o. yo [id number] at [redacted]w[redacted]d was admitted to the hospital 12/11/2022 for induction of labor.  Indication for induction:  borderline chronic HTN, obesity  .  Patient had an labor course complicated by none. She was started on IV pitocin. Intracervical balloon placement. AROM. Epidural Membrane Rupture Time/Date: 4:57 PM ,12/11/2022   Delivery Method:Vaginal, Spontaneous  Episiotomy: None  Lacerations:  2nd degree;Perineal  Details of delivery can be found in separate delivery note.  Patient had a postpartum course complicated by  some BP elevation not requiring medication.  Patient is discharged home on 12/13/2022  Newborn Data: Birth date:12/11/2022  Birth time:10:19 PM  Gender:Female  Living status:Living  Apgars:9 ,9  Weight:3210 g   Magnesium Sulfate received: No BMZ received: No Rhophylac:N/A MMR:No T-DaP:Given prenatally Flu: No Transfusion:No  Physical exam  Vitals:   12/12/22 0852 12/12/22 1322 12/12/22 2132 12/13/22 0515  BP: 128/69 128/68 (!) 106/48 (!) 120/58  Pulse: 62 60 70 (!) 58  Resp: 18 18 16  16   Temp: 98.1 F (36.7 C) 98.3 F (36.8 C) 98.3 F (36.8 C) 98 F (36.7 C)  TempSrc: Oral Oral Oral Oral  SpO2: 100% 99% 99%   Weight:      Height:       General: alert, cooperative, and no distress Lochia: appropriate Uterine Fundus: firm Incision: N/A DVT Evaluation: No evidence of DVT seen on physical exam. Calf/Ankle edema is present Labs: Lab Results  Component Value Date   WBC 15.0 (H) 12/12/2022   HGB 10.9 (L) 12/12/2022   HCT 33.2 (L) 12/12/2022   MCV 93.8 12/12/2022   PLT 270 12/12/2022      Latest Ref Rng & Units 12/04/2020    1:10 PM  CMP  Glucose 70 - 99 mg/dL 89   BUN 6 - 20 mg/dL <5   Creatinine 0.44 - 1.00 mg/dL 0.64   Sodium 135 - 145 mmol/L 134   Potassium 3.5 - 5.1 mmol/L 4.0   Chloride 98 - 111 mmol/L 105   CO2 22 - 32 mmol/L 21   Calcium 8.9 - 10.3 mg/dL 8.8   Total Protein 6.5 - 8.1 g/dL 6.8   Total Bilirubin 0.3 - 1.2 mg/dL 0.6   Alkaline Phos 38 - 126 U/L 79   AST 15 - 41 U/L 22   ALT 0 - 44 U/L 17    Edinburgh Score:    12/12/2022    8:52 AM  Edinburgh Postnatal Depression Scale Screening Tool  I have  been able to laugh and see the funny side of things. 0  I have looked forward with enjoyment to things. 0  I have blamed myself unnecessarily when things went wrong. 0  I have been anxious or worried for no good reason. 0  I have felt scared or panicky for no good reason. 0  Things have been getting on top of me. 0  I have been so unhappy that I have had difficulty sleeping. 0  I have felt sad or miserable. 0  I have been so unhappy that I have been crying. 0  The thought of harming myself has occurred to me. 0  Edinburgh Postnatal Depression Scale Total 0      After visit meds:  Allergies as of 12/13/2022   No Known Allergies      Medication List     TAKE these medications    hydrochlorothiazide 25 MG tablet Commonly known as: HYDRODIURIL Take 1 tablet (25 mg total) by mouth daily for 5 days.   multivitamin with  minerals Tabs tablet Take 1 tablet by mouth every morning.         Discharge home in stable condition Infant Feeding: Breast Infant Disposition:home with mother Discharge instruction: per After Visit Summary and Postpartum booklet. Activity: Advance as tolerated. Pelvic rest for 6 weeks.  Diet: low salt diet and iron rich diet Anticipated Birth Control: Condoms Postpartum Appointment:6 weeks Additional Postpartum F/U:  n/a Future Appointments:No future appointments. Follow up Visit:  Follow-up Information     Servando Salina, MD Follow up in 6 week(s).   Specialty: Obstetrics and Gynecology Contact information: [address] [place] [address] [phone]                     12/13/2022 Marvene Staff, MD

## 2022-12-13 NOTE — Discharge Instructions (Signed)
Call if temperature greater than equal to 100.4, nothing per vagina for 4-6 weeks or severe nausea vomiting, increased incisional pain , drainage or redness in the incision site, no straining with bowel movements, showers no bath °

## 2022-12-22 ENCOUNTER — Telehealth (HOSPITAL_COMMUNITY): Payer: Self-pay | Admitting: *Deleted

## 2022-12-22 NOTE — Telephone Encounter (Signed)
Voice mailbox is full. Unable to leave message.  Odis Hollingshead, RN 12-22-2022 at 3:46pm

## 2023-06-06 DIAGNOSIS — Z114 Encounter for screening for human immunodeficiency virus [HIV]: Secondary | ICD-10-CM | POA: Diagnosis not present

## 2023-06-06 DIAGNOSIS — Z1159 Encounter for screening for other viral diseases: Secondary | ICD-10-CM | POA: Diagnosis not present

## 2023-06-06 DIAGNOSIS — Z01419 Encounter for gynecological examination (general) (routine) without abnormal findings: Secondary | ICD-10-CM | POA: Diagnosis not present

## 2023-06-20 DIAGNOSIS — M25512 Pain in left shoulder: Secondary | ICD-10-CM | POA: Diagnosis not present

## 2023-07-05 ENCOUNTER — Other Ambulatory Visit: Payer: Self-pay | Admitting: Orthopaedic Surgery

## 2023-07-05 ENCOUNTER — Encounter: Payer: Self-pay | Admitting: Orthopaedic Surgery

## 2023-07-05 DIAGNOSIS — M25512 Pain in left shoulder: Secondary | ICD-10-CM

## 2023-07-24 ENCOUNTER — Encounter: Payer: Self-pay | Admitting: Orthopaedic Surgery

## 2023-07-26 ENCOUNTER — Inpatient Hospital Stay
Admission: RE | Admit: 2023-07-26 | Discharge: 2023-07-26 | Disposition: A | Discharge status: Home / Self Care | Payer: BC Managed Care – PPO | Source: Ambulatory Visit | Attending: Orthopaedic Surgery

## 2023-07-26 ENCOUNTER — Ambulatory Visit
Admission: RE | Admit: 2023-07-26 | Discharge: 2023-07-26 | Disposition: A | Discharge status: Home / Self Care | Payer: BC Managed Care – PPO | Source: Ambulatory Visit | Attending: Orthopaedic Surgery | Admitting: Orthopaedic Surgery

## 2023-07-26 DIAGNOSIS — M25512 Pain in left shoulder: Secondary | ICD-10-CM

## 2023-07-26 DIAGNOSIS — M7552 Bursitis of left shoulder: Secondary | ICD-10-CM | POA: Diagnosis not present

## 2023-07-26 DIAGNOSIS — M75102 Unspecified rotator cuff tear or rupture of left shoulder, not specified as traumatic: Secondary | ICD-10-CM | POA: Diagnosis not present

## 2023-07-26 MED ORDER — IOPAMIDOL (ISOVUE-M 200) INJECTION 41%
10.0000 mL | Freq: Once | INTRAMUSCULAR | Status: AC
  Administered 2023-07-26: 10 mL via INTRA_ARTICULAR

## 2023-08-08 DIAGNOSIS — M25512 Pain in left shoulder: Secondary | ICD-10-CM | POA: Diagnosis not present

## 2023-08-15 DIAGNOSIS — M7521 Bicipital tendinitis, right shoulder: Secondary | ICD-10-CM | POA: Diagnosis not present

## 2023-08-15 DIAGNOSIS — M25511 Pain in right shoulder: Secondary | ICD-10-CM | POA: Diagnosis not present

## 2023-08-22 DIAGNOSIS — S46012D Strain of muscle(s) and tendon(s) of the rotator cuff of left shoulder, subsequent encounter: Secondary | ICD-10-CM | POA: Diagnosis not present

## 2023-08-29 DIAGNOSIS — S46012D Strain of muscle(s) and tendon(s) of the rotator cuff of left shoulder, subsequent encounter: Secondary | ICD-10-CM | POA: Diagnosis not present

## 2023-09-05 DIAGNOSIS — S46012D Strain of muscle(s) and tendon(s) of the rotator cuff of left shoulder, subsequent encounter: Secondary | ICD-10-CM | POA: Diagnosis not present

## 2023-09-18 DIAGNOSIS — S46012D Strain of muscle(s) and tendon(s) of the rotator cuff of left shoulder, subsequent encounter: Secondary | ICD-10-CM | POA: Diagnosis not present

## 2023-09-26 DIAGNOSIS — S46012D Strain of muscle(s) and tendon(s) of the rotator cuff of left shoulder, subsequent encounter: Secondary | ICD-10-CM | POA: Diagnosis not present

## 2024-04-18 DIAGNOSIS — S46012D Strain of muscle(s) and tendon(s) of the rotator cuff of left shoulder, subsequent encounter: Secondary | ICD-10-CM | POA: Diagnosis not present

## 2024-04-29 NOTE — Patient Instructions (Addendum)
 SURGICAL WAITING ROOM VISITATION  Patients having surgery or a procedure may have no more than 2 support people in the waiting area - these visitors may rotate.    Children under the age of 101 must have an adult with them who is not the patient.  Visitors with respiratory illnesses are discouraged from visiting and should remain at home.  If the patient needs to stay at the hospital during part of their recovery, the visitor guidelines for inpatient rooms apply. Pre-op nurse will coordinate an appropriate time for 1 support person to accompany patient in pre-op.  This support person may not rotate.    Please refer to the [facility] website for the visitor guidelines for Inpatients (after your surgery is over and you are in a regular room).       Your procedure is scheduled on:  05/15/24    Report to [facility] Main Entrance    Report to admitting at   1130AM   Call this number if you have problems the morning of surgery [phone]   Do not eat food :After Midnight.   After Midnight you may have the following liquids until _ 1100_____ AM DAY OF SURGERY  Water Non-Citrus Juices (without pulp, NO RED-Apple, White grape, White cranberry) Black Coffee (NO MILK/CREAM OR CREAMERS, sugar ok)  Clear Tea (NO MILK/CREAM OR CREAMERS, sugar ok) regular and decaf                             Plain Jell-O (NO RED)                                           Fruit ices (not with fruit pulp, NO RED)                                     Popsicles (NO RED)                                                               Sports drinks like Gatorade (NO RED)                            If you have questions, please contact your surgeon's office.       Oral Hygiene is also important to reduce your risk of infection.                                    Remember - BRUSH YOUR TEETH THE MORNING OF SURGERY WITH YOUR REGULAR TOOTHPASTE  DENTURES WILL BE REMOVED PRIOR TO SURGERY PLEASE DO NOT  APPLY Poly grip OR ADHESIVES!!!   Do NOT smoke after Midnight   Stop all vitamins and herbal supplements 7 days before surgery.   Take these medicines the morning of surgery with A SIP OF WATER:   none   DO NOT TAKE ANY ORAL DIABETIC MEDICATIONS DAY OF YOUR SURGERY  Bring CPAP mask and tubing  day of surgery.                              You may not have any metal on your body including hair pins, jewelry, and body piercing             Do not wear make-up, lotions, powders, perfumes/cologne, or deodorant  Do not wear nail polish including gel and S&S, artificial/acrylic nails, or any other type of covering on natural nails including finger and toenails. If you have artificial nails, gel coating, etc. that needs to be removed by a nail salon please have this removed prior to surgery or surgery may need to be canceled/ delayed if the surgeon/ anesthesia feels like they are unable to be safely monitored.   Do not shave  48 hours prior to surgery.               Men may shave face and neck.   Do not bring valuables to the hospital. [facility] IS NOT             RESPONSIBLE   FOR VALUABLES.   Contacts, glasses, dentures or bridgework may not be worn into surgery.   Bring small overnight bag day of surgery.   DO NOT BRING YOUR HOME MEDICATIONS TO THE HOSPITAL. PHARMACY WILL DISPENSE MEDICATIONS LISTED ON YOUR MEDICATION LIST TO YOU DURING YOUR ADMISSION IN THE HOSPITAL!    Patients discharged on the day of surgery will not be allowed to drive home.  Someone NEEDS to stay with you for the first 24 hours after anesthesia.   Special Instructions: Bring a copy of your healthcare power of attorney and living will documents the day of surgery if you haven't scanned them before.              Please read over the following fact sheets you were given: IF YOU HAVE QUESTIONS ABOUT YOUR PRE-OP INSTRUCTIONS PLEASE CALL [phone].   If you received a COVID test during your pre-op visit  it is  requested that you wear a mask when out in public, stay away from anyone that may not be feeling well and notify your surgeon if you develop symptoms. If you test positive for Covid or have been in contact with anyone that has tested positive in the last 10 days please notify you surgeon.    Media - Preparing for Surgery Before surgery, you can play an important role.  Because skin is not sterile, your skin needs to be as free of germs as possible.  You can reduce the number of germs on your skin by washing with CHG (chlorahexidine gluconate) soap before surgery.  CHG is an antiseptic cleaner which kills germs and bonds with the skin to continue killing germs even after washing. Please DO NOT use if you have an allergy to CHG or antibacterial soaps.  If your skin becomes reddened/irritated stop using the CHG and inform your nurse when you arrive at Short Stay. Do not shave (including legs and underarms) for at least 48 hours prior to the first CHG shower.  You may shave your face/neck. Please follow these instructions carefully:  1.  Shower with CHG Soap the night before surgery and the  morning of Surgery.  2.  If you choose to wash your hair, wash your hair first as usual with your  normal  shampoo.  3.  After you shampoo, rinse your hair and body  thoroughly to remove the  shampoo.                           4.  Use CHG as you would any other liquid soap.  You can apply chg directly  to the skin and wash                       Gently with a scrungie or clean washcloth.  5.  Apply the CHG Soap to your body ONLY FROM THE NECK DOWN.   Do not use on face/ open                           Wound or open sores. Avoid contact with eyes, ears mouth and genitals (private parts).                       Wash face,  Genitals (private parts) with your normal soap.             6.  Wash thoroughly, paying special attention to the area where your surgery  will be performed.  7.  Thoroughly rinse your body with warm  water from the neck down.  8.  DO NOT shower/wash with your normal soap after using and rinsing off  the CHG Soap.                9.  Pat yourself dry with a clean towel.            10.  Wear clean pajamas.            11.  Place clean sheets on your bed the night of your first shower and do not  sleep with pets. Day of Surgery : Do not apply any lotions/deodorants the morning of surgery.  Please wear clean clothes to the hospital/surgery center.  FAILURE TO FOLLOW THESE INSTRUCTIONS MAY RESULT IN THE CANCELLATION OF YOUR SURGERY PATIENT SIGNATURE_________________________________  NURSE SIGNATURE__________________________________  ________________________________________________________________________

## 2024-04-29 NOTE — Progress Notes (Signed)
 Anesthesia Review:  PCP: Cardiologist :  PPM/ ICD: Device Orders: Rep Notified:  Chest x-ray : EKG : Echo : Stress test: Cardiac Cath :   Activity level:  Sleep Study/ CPAP : Fasting Blood Sugar :      / Checks Blood Sugar -- times a day:    Blood Thinner/ Instructions /Last Dose: ASA / Instructions/ Last Dose :

## 2024-05-02 ENCOUNTER — Encounter (HOSPITAL_COMMUNITY)
Admission: RE | Admit: 2024-05-02 | Discharge: 2024-05-02 | Disposition: A | Discharge status: Home / Self Care | Source: Ambulatory Visit | Attending: Obstetrics and Gynecology | Admitting: Obstetrics and Gynecology

## 2024-05-06 DIAGNOSIS — M25512 Pain in left shoulder: Secondary | ICD-10-CM | POA: Diagnosis not present

## 2024-05-15 ENCOUNTER — Ambulatory Visit (HOSPITAL_COMMUNITY): Admit: 2024-05-15 | Admitting: Orthopaedic Surgery

## 2024-05-15 SURGERY — ARTHROSCOPY, SHOULDER, WITH ROTATOR CUFF REPAIR
Anesthesia: General | Laterality: Left

## 2024-05-30 ENCOUNTER — Encounter: Payer: Self-pay | Admitting: Family Medicine

## 2024-05-30 ENCOUNTER — Ambulatory Visit: Payer: BC Managed Care – PPO | Admitting: Family Medicine

## 2024-05-30 VITALS — BP 142/84 | HR 82 | Ht 67.0 in | Wt 365.0 lb

## 2024-05-30 DIAGNOSIS — R430 Anosmia: Secondary | ICD-10-CM | POA: Diagnosis not present

## 2024-05-30 DIAGNOSIS — R030 Elevated blood-pressure reading, without diagnosis of hypertension: Secondary | ICD-10-CM

## 2024-05-30 DIAGNOSIS — U099 Post covid-19 condition, unspecified: Secondary | ICD-10-CM

## 2024-05-30 DIAGNOSIS — Z7689 Persons encountering health services in other specified circumstances: Secondary | ICD-10-CM

## 2024-05-30 DIAGNOSIS — M75112 Incomplete rotator cuff tear or rupture of left shoulder, not specified as traumatic: Secondary | ICD-10-CM

## 2024-05-30 DIAGNOSIS — R6882 Decreased libido: Secondary | ICD-10-CM

## 2024-05-30 NOTE — Progress Notes (Signed)
 Established Patient Office Visit   Subjective  Patient ID: Kelsey Ellison, female    DOB: 06/07/1991  Age: 33 y.o. MRN: [id number]  Chief Complaint  Patient presents with   New Patient (Initial Visit)    Pt is a 33 yo female seen to est care and f/u on ongoing concerns.  Pt seen by OB/Gyn, Dr. Rutherford.  Has a torn rotator cuff in her left shoulder, attributed to overuse.  Pt is L handed.  Seeing Ortho.  Has surgery  scheduled for August 27 but postponed it due to her responsibilities as a mother to a one-year-old, complicating recovery.  History of elevated blood pressure during pregnancy, which was managed with baby aspirin. Since her last pregnancy, she has not experienced any blood pressure issues and is not on any medication for it.  Cooking at home.  Does not use much salt when cooking.  Following three episodes of COVID-19, she has experienced changes in her sense of smell, although some improvement has been noted recently. She is interested in seeing an ENT specialist for further evaluation.  Decrease in sexual desire since the birth of her youngest child 18 months ago. Her menstrual cycles are regular, and she has not yet discussed these concerns with her OB.  She is interested in exploring weight loss options, including potential use of weight loss injections, as she is heavier and wants to make lifestyle changes. She is generally active, trying to exercise three times a week, but her routine varies due to her responsibilities as a mother.  She drinks about three and a half to four bottles of water daily and tries to make healthy food choices, although she sometimes eats on the fly due to her busy schedule with two young children.  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  She was a surgical tech.  Pt is adopted.  Her mother and father are her biological paternal aunt and uncle.  H/o breast ca in 2 paternal aunts.   Pt's Dentist: Dr. Bethany.  Has well woman exam scheduled 06/11/24 with Dr. Rutherford.    Patient Active Problem List   Diagnosis Date Noted   Postpartum care following vaginal delivery 12/05/2020   Indication for care in labor or delivery 12/04/2020   Past Medical History:  Diagnosis Date   Anemia    Morbid obesity (HCC)    Obesity affecting pregnancy    Past Surgical History:  Procedure Laterality Date   NO PAST SURGERIES     Social History   Tobacco Use   Smoking status: Never   Smokeless tobacco: Never  Vaping Use   Vaping status: Never Used  Substance Use Topics   Alcohol use: Not Currently   Drug use: Not Currently   Family History  Problem Relation Age of Onset   Cancer Neg Hx    Diabetes Neg Hx    Hypertension Neg Hx    No Known Allergies  ROS Negative unless stated above    Objective:     BP (!) 142/84 (BP Location: Left Wrist, Patient Position: Sitting, Cuff Size: Normal)   Pulse 82   Ht 5' 7 (1.702 m)   Wt (!) 365 lb (165.6 kg)   LMP 05/04/2024 (Exact Date)   SpO2 99%   Breastfeeding Unknown   BMI 57.17 kg/m  BP Readings from Last 3 Encounters:  05/30/24 (!) 142/84  12/13/22 (!) 120/58  09/29/22 106/75  Wt Readings from Last 3 Encounters:  05/30/24 (!) 365 lb (165.6 kg)  12/11/22 (!) 359 lb (162.8 kg)  12/04/20 (!) 352 lb (159.7 kg)      Physical Exam Constitutional:      General: She is not in acute distress.    Appearance: Normal appearance. She is obese.  HENT:     Head: Normocephalic and atraumatic.     Nose: Nose normal.     Mouth/Throat:     Mouth: Mucous membranes are moist.  Cardiovascular:     Rate and Rhythm: Normal rate and regular rhythm.     Heart sounds: Normal heart sounds. No murmur heard.    No gallop.  Pulmonary:     Effort: Pulmonary effort is normal. No respiratory distress.     Breath sounds: Normal breath sounds. No wheezing, rhonchi or rales.  Skin:    General: Skin is warm and dry.  Neurological:      Mental Status: She is alert and oriented to person, place, and time.        05/30/2024   12:18 PM  Depression screen PHQ 2/9  Decreased Interest 0  Down, Depressed, Hopeless 0  PHQ - 2 Score 0  Altered sleeping 0  Tired, decreased energy 0  Change in appetite 0  Feeling bad or failure about yourself  0  Trouble concentrating 0  Moving slowly or fidgety/restless 0  Suicidal thoughts 0  PHQ-9 Score 0      05/30/2024   12:18 PM  GAD 7 : Generalized Anxiety Score  Nervous, Anxious, on Edge 0  Control/stop worrying 0  Worry too much - different things 0  Trouble relaxing 0  Restless 0  Easily annoyed or irritable 0  Afraid - awful might happen 0  Total GAD 7 Score 0     No results found for any visits on 05/30/24.    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  Smell retraining.  Referral to ENT.  BP elevated in clinic.  Recheck remains elevated.  Lifestyle modifications.  Monitor bp at home. Keep log of readings. For continued elevation start medication.  Body mass index is 57.17 kg/m.  Lifestyle modifications.  Consider referral to healthy wt.  Continue f/u with Ortho for L rotator cuff tear.  Discussed possible causes including fatigue due to caring for children.  Not currently on meds that could contribute.  Consider scheduling intimate activities with partner.  Consider labs to evaluate thryoid and vitamin def.  F/u with OB/Gyn encourage regarding libido.  F/u at pt's convenience for CPE.  Return for CPE.   Clotilda JONELLE Single, MD

## 2024-06-11 DIAGNOSIS — Z113 Encounter for screening for infections with a predominantly sexual mode of transmission: Secondary | ICD-10-CM | POA: Diagnosis not present

## 2024-06-11 DIAGNOSIS — Z01419 Encounter for gynecological examination (general) (routine) without abnormal findings: Secondary | ICD-10-CM | POA: Diagnosis not present

## 2024-06-12 DIAGNOSIS — Z114 Encounter for screening for human immunodeficiency virus [HIV]: Secondary | ICD-10-CM | POA: Diagnosis not present

## 2024-06-12 DIAGNOSIS — Z1159 Encounter for screening for other viral diseases: Secondary | ICD-10-CM | POA: Diagnosis not present

## 2024-07-31 ENCOUNTER — Institutional Professional Consult (permissible substitution) (INDEPENDENT_AMBULATORY_CARE_PROVIDER_SITE_OTHER): Admitting: Otolaryngology

## 2024-09-26 ENCOUNTER — Institutional Professional Consult (permissible substitution) (INDEPENDENT_AMBULATORY_CARE_PROVIDER_SITE_OTHER): Admitting: Otolaryngology

## 2024-10-24 ENCOUNTER — Ambulatory Visit (INDEPENDENT_AMBULATORY_CARE_PROVIDER_SITE_OTHER): Admitting: Otolaryngology

## 2024-10-24 ENCOUNTER — Encounter (INDEPENDENT_AMBULATORY_CARE_PROVIDER_SITE_OTHER): Payer: Self-pay | Admitting: Otolaryngology

## 2024-10-24 VITALS — BP 119/78 | HR 70 | Ht 67.0 in | Wt 350.0 lb

## 2024-10-24 DIAGNOSIS — J343 Hypertrophy of nasal turbinates: Secondary | ICD-10-CM

## 2024-10-24 DIAGNOSIS — J310 Chronic rhinitis: Secondary | ICD-10-CM

## 2024-10-24 DIAGNOSIS — R430 Anosmia: Secondary | ICD-10-CM

## 2024-10-24 DIAGNOSIS — J342 Deviated nasal septum: Secondary | ICD-10-CM

## 2024-10-24 NOTE — Progress Notes (Unsigned)
 CC: Chronic anosmia  Discussed the use of AI scribe software for clinical note transcription with the patient, who gave verbal consent to proceed.  History of Present Illness Kelsey Ellison is a 34 year old female with recurrent COVID-19 infections who presents with persistent anosmia.  She developed complete anosmia following her initial COVID-19 infection in early 2022, which occurred during pregnancy. Since then, she has had two additional COVID-19 infections with partial improvement in olfactory function. She is now able to detect strong odors, such as perfume or kitchen smells, particularly when in close proximity, but remains unable to perceive milder scents unless very near. She describes her current olfactory function as being able to smell a little bit now.  She has longstanding allergic rhinitis with intermittent nasal congestion, particularly affecting the left side. She uses oral cetirizine as needed during allergy season and does not use daily allergy medications or nasal sprays. She has no history of otolaryngologic surgery.  Her pregnancy was uncomplicated by COVID-19, and she received antiviral therapy during her initial infection.     Past Medical History:  Diagnosis Date   Anemia    Morbid obesity (HCC)    Obesity affecting pregnancy     Past Surgical History:  Procedure Laterality Date   NO PAST SURGERIES      Family History  Problem Relation Age of Onset   Cancer Neg Hx    Diabetes Neg Hx    Hypertension Neg Hx     Social History:  reports that she has never smoked. She has never used smokeless tobacco. She reports that she does not currently use alcohol. She reports that she does not currently use drugs.  Allergies: Allergies[1]  Prior to Admission medications  Medication Sig Start Date End Date Taking? Authorizing Provider  Elderberry-Vitamin C-Zinc (ELDERBERRY IMMUNE HEALTH GUMMY) 50-45-3.8 MG CHEW Chew by mouth.    [provider]  Iron  Combinations (IRON COMPLEX PO) Take by mouth.    [provider]  Multiple Vitamin (MULTIVITAMIN WITH MINERALS) TABS tablet Take 1 tablet by mouth every morning.    [provider]    Blood pressure 119/78, pulse 70, height 5' 7 (1.702 m), weight (!) 350 lb (158.8 kg), SpO2 99%, unknown if currently breastfeeding. Exam: General: Communicates without difficulty, well nourished, no acute distress. Head: Normocephalic, no evidence injury, no tenderness, facial buttresses intact without stepoff. Face/sinus: No tenderness to palpation and percussion. Facial movement is normal and symmetric. Eyes: PERRL, EOMI. No scleral icterus, conjunctivae clear. Neuro: CN II exam reveals vision grossly intact.  No nystagmus at any point of gaze. Ears: Auricles well formed without lesions.  Ear canals are intact without mass or lesion.  No erythema or edema is appreciated.  The TMs are intact without fluid. Nose: External evaluation reveals normal support and skin without lesions.  Dorsum is intact.  Anterior rhinoscopy reveals congested mucosa over anterior aspect of inferior turbinates and deviated septum.  No purulence noted. Oral:  Oral cavity and oropharynx are intact, symmetric, without erythema or edema.  Mucosa is moist without lesions. Neck: Full range of motion without pain.  There is no significant lymphadenopathy.  No masses palpable.  Thyroid  bed within normal limits to palpation.  Parotid glands and submandibular glands equal bilaterally without mass.  Trachea is midline. Neuro:  CN 2-12 grossly intact.   Procedure:  Flexible Nasal Endoscopy: Description: Risks, benefits, and alternatives of flexible endoscopy were explained to the patient.  Specific mention was made of the risk  of throat numbness with difficulty swallowing, possible bleeding from the nose and mouth, and pain from the procedure.  The patient gave oral consent to proceed.  The flexible scope was inserted into the right nasal  cavity.  Endoscopy of the interior nasal cavity, superior, inferior, and middle meatus was performed. The sphenoid-ethmoid recess was examined. Edematous mucosa was noted.  No polyp, mass, or lesion was appreciated. Nasal septal deviation noted. Olfactory cleft was clear.  Nasopharynx was clear.  Turbinates were hypertrophied but without mass.  The procedure was repeated on the contralateral side with similar findings.  The patient tolerated the procedure well.   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 Partial recovery of olfaction suggests incomplete olfactory nerve injury. 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  - Performed nasal endoscopy to exclude mechanical obstruction. - Provided education regarding post-viral olfactory nerve injury as the etiology. - Advised that no current treatment exists to regenerate olfactory nerve function and further recovery may occur over time.  Chronic rhinitis She reports intermittent nasal congestion, predominantly left-sided, associated with allergic symptoms. Nasal endoscopy demonstrated mildly edematous turbinates, especially on the left, without significant obstruction. Intranasal corticosteroids are the most effective therapy for symptom control. - Recommended daily use of over-the-counter intranasal corticosteroid spray (e.g., fluticasone, mometasone, budesonide), two sprays per nostril once daily for several weeks. - Provided education on the efficacy of intranasal corticosteroids for reducing nasal congestion.  Deviated nasal septum Nasal endoscopy revealed a mildly deviated septum, more pronounced on the left, without significant obstruction to airflow or the olfactory cleft.  - Provided reassurance that the septal  deviation is mild, not causing significant obstruction, and not contributing to olfactory dysfunction at this time.       Kelsey Ellison Kelsey Ellison 10/24/2024, 4:23 PM      [1] No Known Allergies

## 2024-10-25 DIAGNOSIS — R430 Anosmia: Secondary | ICD-10-CM | POA: Insufficient documentation

## 2024-10-25 DIAGNOSIS — J342 Deviated nasal septum: Secondary | ICD-10-CM | POA: Insufficient documentation

## 2024-10-25 DIAGNOSIS — J343 Hypertrophy of nasal turbinates: Secondary | ICD-10-CM | POA: Insufficient documentation

## 2024-10-25 DIAGNOSIS — J310 Chronic rhinitis: Secondary | ICD-10-CM | POA: Insufficient documentation
